# Patient Record
Sex: Female | Born: 1980 | Race: White | Hispanic: No | Marital: Single | Smoking: Former smoker
Health system: Southern US, Community
[De-identification: ages and names within clinical notes are randomized; demographics above are authoritative.]

## PROBLEM LIST (undated history)

## (undated) DIAGNOSIS — F419 Anxiety disorder, unspecified: Secondary | ICD-10-CM

## (undated) DIAGNOSIS — M5136 Other intervertebral disc degeneration, lumbar region: Secondary | ICD-10-CM

## (undated) DIAGNOSIS — D649 Anemia, unspecified: Secondary | ICD-10-CM

## (undated) DIAGNOSIS — G039 Meningitis, unspecified: Secondary | ICD-10-CM

## (undated) DIAGNOSIS — K589 Irritable bowel syndrome without diarrhea: Secondary | ICD-10-CM

## (undated) DIAGNOSIS — M51369 Other intervertebral disc degeneration, lumbar region without mention of lumbar back pain or lower extremity pain: Secondary | ICD-10-CM

## (undated) DIAGNOSIS — F329 Major depressive disorder, single episode, unspecified: Secondary | ICD-10-CM

## (undated) DIAGNOSIS — F32A Depression, unspecified: Secondary | ICD-10-CM

## (undated) DIAGNOSIS — R519 Headache, unspecified: Secondary | ICD-10-CM

## (undated) HISTORY — PX: TUBAL LIGATION: SHX77

---
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
## 2004-12-09 HISTORY — PX: ECTOPIC PREGNANCY SURGERY: SHX613

## 2011-12-10 HISTORY — PX: DILATION AND CURETTAGE OF UTERUS: SHX78

## 2015-02-27 ENCOUNTER — Encounter (HOSPITAL_COMMUNITY): Payer: Self-pay | Admitting: Obstetrics and Gynecology

## 2015-02-27 ENCOUNTER — Other Ambulatory Visit (HOSPITAL_COMMUNITY): Payer: Self-pay | Admitting: Obstetrics and Gynecology

## 2015-02-27 DIAGNOSIS — Z3689 Encounter for other specified antenatal screening: Secondary | ICD-10-CM

## 2015-02-27 DIAGNOSIS — O283 Abnormal ultrasonic finding on antenatal screening of mother: Secondary | ICD-10-CM

## 2015-02-27 DIAGNOSIS — O4103X1 Oligohydramnios, third trimester, fetus 1: Secondary | ICD-10-CM

## 2015-03-07 ENCOUNTER — Ambulatory Visit (HOSPITAL_COMMUNITY): Payer: Medicaid Other

## 2015-03-07 ENCOUNTER — Encounter (HOSPITAL_COMMUNITY): Payer: Self-pay

## 2017-12-17 ENCOUNTER — Other Ambulatory Visit (HOSPITAL_COMMUNITY): Payer: Self-pay | Admitting: Neurology

## 2017-12-17 DIAGNOSIS — G35 Multiple sclerosis: Secondary | ICD-10-CM

## 2017-12-25 ENCOUNTER — Ambulatory Visit (HOSPITAL_COMMUNITY)
Admission: RE | Admit: 2017-12-25 | Discharge: 2017-12-25 | Disposition: A | Discharge status: Home / Self Care | Payer: Medicaid Other | Source: Ambulatory Visit | Attending: Neurology | Admitting: Neurology

## 2017-12-25 DIAGNOSIS — M47892 Other spondylosis, cervical region: Secondary | ICD-10-CM | POA: Diagnosis not present

## 2017-12-25 DIAGNOSIS — G35 Multiple sclerosis: Secondary | ICD-10-CM | POA: Insufficient documentation

## 2018-01-01 ENCOUNTER — Other Ambulatory Visit (HOSPITAL_COMMUNITY): Payer: Self-pay | Admitting: Neurology

## 2018-01-01 DIAGNOSIS — M545 Low back pain: Secondary | ICD-10-CM

## 2018-01-12 ENCOUNTER — Ambulatory Visit (HOSPITAL_COMMUNITY)
Admission: RE | Admit: 2018-01-12 | Discharge: 2018-01-12 | Disposition: A | Discharge status: Home / Self Care | Payer: Medicaid Other | Source: Ambulatory Visit | Attending: Neurology | Admitting: Neurology

## 2018-01-12 ENCOUNTER — Ambulatory Visit (HOSPITAL_COMMUNITY): Payer: Medicaid Other

## 2018-01-12 ENCOUNTER — Other Ambulatory Visit (HOSPITAL_COMMUNITY): Payer: Self-pay | Admitting: Neurology

## 2018-01-12 DIAGNOSIS — M4186 Other forms of scoliosis, lumbar region: Secondary | ICD-10-CM | POA: Insufficient documentation

## 2018-01-12 DIAGNOSIS — M545 Low back pain: Secondary | ICD-10-CM | POA: Diagnosis not present

## 2018-01-12 DIAGNOSIS — M47896 Other spondylosis, lumbar region: Secondary | ICD-10-CM | POA: Diagnosis not present

## 2018-06-16 ENCOUNTER — Other Ambulatory Visit (HOSPITAL_COMMUNITY): Payer: Self-pay | Admitting: Neurology

## 2018-06-16 DIAGNOSIS — M5416 Radiculopathy, lumbar region: Secondary | ICD-10-CM

## 2018-06-25 ENCOUNTER — Ambulatory Visit (HOSPITAL_COMMUNITY): Payer: Medicaid Other

## 2018-07-03 ENCOUNTER — Ambulatory Visit (HOSPITAL_COMMUNITY)
Admission: RE | Admit: 2018-07-03 | Discharge: 2018-07-03 | Disposition: A | Discharge status: Home / Self Care | Payer: Medicaid Other | Source: Ambulatory Visit | Attending: Neurology | Admitting: Neurology

## 2018-07-03 DIAGNOSIS — M48061 Spinal stenosis, lumbar region without neurogenic claudication: Secondary | ICD-10-CM | POA: Diagnosis not present

## 2018-07-03 DIAGNOSIS — M5416 Radiculopathy, lumbar region: Secondary | ICD-10-CM | POA: Diagnosis present

## 2018-07-03 DIAGNOSIS — M5126 Other intervertebral disc displacement, lumbar region: Secondary | ICD-10-CM | POA: Diagnosis not present

## 2019-10-14 ENCOUNTER — Encounter (HOSPITAL_COMMUNITY): Payer: Self-pay | Admitting: Emergency Medicine

## 2019-10-14 ENCOUNTER — Emergency Department (HOSPITAL_COMMUNITY)
Admission: EM | Admit: 2019-10-14 | Discharge: 2019-10-14 | Disposition: A | Discharge status: Home / Self Care | Payer: Medicaid Other | Attending: Emergency Medicine | Admitting: Emergency Medicine

## 2019-10-14 ENCOUNTER — Other Ambulatory Visit: Payer: Self-pay

## 2019-10-14 DIAGNOSIS — M5410 Radiculopathy, site unspecified: Secondary | ICD-10-CM | POA: Insufficient documentation

## 2019-10-14 DIAGNOSIS — M25552 Pain in left hip: Secondary | ICD-10-CM | POA: Diagnosis present

## 2019-10-14 DIAGNOSIS — Z87891 Personal history of nicotine dependence: Secondary | ICD-10-CM | POA: Diagnosis not present

## 2019-10-14 DIAGNOSIS — M25572 Pain in left ankle and joints of left foot: Secondary | ICD-10-CM | POA: Insufficient documentation

## 2019-10-14 HISTORY — DX: Meningitis, unspecified: G03.9

## 2019-10-14 HISTORY — DX: Other intervertebral disc degeneration, lumbar region: M51.36

## 2019-10-14 HISTORY — DX: Other intervertebral disc degeneration, lumbar region without mention of lumbar back pain or lower extremity pain: M51.369

## 2019-10-14 LAB — URINALYSIS, ROUTINE W REFLEX MICROSCOPIC
Bilirubin Urine: NEGATIVE
Glucose, UA: NEGATIVE mg/dL
Ketones, ur: NEGATIVE mg/dL
Leukocytes,Ua: NEGATIVE
Nitrite: NEGATIVE
Protein, ur: NEGATIVE mg/dL
Specific Gravity, Urine: 1.014 (ref 1.005–1.030)
pH: 5 (ref 5.0–8.0)

## 2019-10-14 MED ORDER — HYDROMORPHONE HCL 1 MG/ML IJ SOLN
1.0000 mg | Freq: Once | INTRAMUSCULAR | Status: AC
  Administered 2019-10-14: 20:00:00 1 mg via INTRAMUSCULAR
  Filled 2019-10-14: qty 1

## 2019-10-14 NOTE — ED Notes (Signed)
Pt ambulated to rest room with no assist and without distress.

## 2019-10-14 NOTE — Discharge Instructions (Signed)
As discussed, call the radiology scheduling department at [phone] tomorrow to arrange an appointment time to have an MRI of your lower spine.  Be sure to keep your appointment with your pain management provider tomorrow.  Return to the ER for any worsening symptoms.

## 2019-10-14 NOTE — ED Provider Notes (Signed)
[facility] EMERGENCY DEPARTMENT Provider Note   CSN: [id number] Arrival date & time: 10/14/19  1630     History   Chief Complaint Chief Complaint  Patient presents with  . Hip Pain  . Ankle Pain    HPI RIVERLYN COYKENDALL is a 38 y.o. female.     HPI   ANVITHA LYBROOK is a 38 y.o. female with past medical history of lumbar degenerative disc disease and chronic back pain.  She presents to the Emergency Department complaining of worsening of her chronic pain for 3 weeks.  She describes a sharp burning pain in her left hip and left lateral ankle.  She states the pain is constant but worse with weightbearing.  At times, she states her leg feels as though it is going to "give away."  She also states that she has been having intermittent difficulties with urination and defecation.  She has been incontinent of stool and urine, but states this waxes and wanes and has been occurring for 1 week.   She states she was seen at another emergency department last week for her symptoms and received a injection for pain that provided temporary relief. She is followed by pain management and has been receiving steroid injections in her spine.  She is scheduled to see her pain management provider tomorrow.  She denies fever, chills, abdominal pain, pain with urination or defecation and numbness of the extremity.  She has been taking hydrocodone for her pain which provides minimal relief.        Past Medical History:  Diagnosis Date  . DDD (degenerative disc disease), lumbar   . DDD (degenerative disc disease), lumbar   . Meningitis     There are no active problems to display for this patient.   Past Surgical History:  Procedure Laterality Date  . CESAREAN SECTION    . DILATION AND CURETTAGE OF UTERUS    . TUBAL LIGATION       OB History   No obstetric history on file.      Home Medications    Prior to Admission medications   Not on File    Family History No family history on file.   Social History Social History   Tobacco Use  . Smoking status: Former Games developer  . Smokeless tobacco: Never Used  Substance Use Topics  . Alcohol use: Never    Frequency: Never  . Drug use: Never     Allergies   Patient has no known allergies.   Review of Systems Review of Systems  Constitutional: Negative for fever.  Respiratory: Negative for shortness of breath.   Gastrointestinal: Negative for abdominal pain, constipation and vomiting.  Genitourinary: Negative for decreased urine volume, difficulty urinating, dysuria, flank pain and hematuria.  Musculoskeletal: Positive for back pain. Negative for joint swelling.  Skin: Negative for rash.  Neurological: Negative for weakness and numbness.  All other systems reviewed and are negative.    Physical Exam Updated Vital Signs BP 139/67 (BP Location: Left Arm)   Pulse 99   Temp 98.2 F (36.8 C) (Oral)   Resp 16   Ht 5\' 8"  (1.727 m)   Wt 75.3 kg   LMP 10/11/2019   SpO2 99%   BMI 25.24 kg/m   Physical Exam Vitals signs and nursing note reviewed.  Constitutional:      General: She is not in acute distress.    Appearance: Normal appearance. She is well-developed.  HENT:     Head: Atraumatic.  Neck:     Musculoskeletal: Normal range of motion and neck supple.  Cardiovascular:     Rate and Rhythm: Normal rate and regular rhythm.     Pulses: Normal pulses.     Comments: DP pulses are strong and palpable bilaterally Pulmonary:     Effort: Pulmonary effort is normal. No respiratory distress.     Breath sounds: Normal breath sounds.  Abdominal:     General: There is no distension.     Palpations: Abdomen is soft.     Tenderness: There is no abdominal tenderness.  Genitourinary:    Rectum: No mass, tenderness or anal fissure. Normal anal tone.     Comments: nml rectal tone, no tenderness or palpable rectal masses.   Musculoskeletal:        General: Tenderness present.     Lumbar back: She exhibits tenderness and  pain. She exhibits normal range of motion, no swelling, no deformity, no laceration and normal pulse.     Right lower leg: No edema.     Left lower leg: No edema.     Comments: ttp of the lower lumbar spine and left SI joint.  No bony step offs or edema.  ttp of the lateral left hip with pain on flexion of the hip.  Pt unable to perform SLR on left due to level of pain.    Skin:    General: Skin is warm.     Capillary Refill: Capillary refill takes less than 2 seconds.     Findings: No rash.  Neurological:     Mental Status: She is alert and oriented to person, place, and time.     Sensory: No sensory deficit.     Motor: No abnormal muscle tone.     Coordination: Coordination normal.     Gait: Gait normal.     Deep Tendon Reflexes:     Reflex Scores:      Patellar reflexes are 2+ on the right side and 2+ on the left side.      Achilles reflexes are 2+ on the right side and 2+ on the left side.     ED Treatments / Results  Labs (all labs ordered are listed, but only abnormal results are displayed) Labs Reviewed  URINALYSIS, ROUTINE W REFLEX MICROSCOPIC - Abnormal; Notable for the following components:      Result Value   Hgb urine dipstick SMALL (*)    Bacteria, UA RARE (*)    All other components within normal limits    EKG None  Radiology No results found.  Procedures Procedures (including critical care time)  Medications Ordered in ED Medications - No data to display   Initial Impression / Assessment and Plan / ED Course  I have reviewed the triage vital signs and the nursing notes.  Pertinent labs & imaging results that were available during my care of the patient were reviewed by me and considered in my medical decision making (see chart for details).        Pt with likely acute on chronic pain radicular back pain.  No focal neuro deficits on my exam.  She is ambulatory with a slow but steady gait.  No foot drop.  She describes intermittent episodes of  incontinence of stool and urine for 1 week, but but the episodes are sporadic.  She has good rectal tone on digital exam.  Clinically, my suspicion for cauda equina is low.  She is ambulated to the restroom and voided for urinalysis  without difficulty or incontinence.  She has received spinal epidurals in the past, but none recently.  She is nontoxic-appearing, afebrile and pain is not out of proportion to exam findings. I do not feel that back pain is related to a spinal abscess.  On review of medical records, she had MRI of her L-spine in July 2019 that showed chronic disc and endplate degeneration at L4-L5 and 5 S1 with a broad-based disc protrusion at the L4-5 level with mild spinal stenosis with right greater than left.  Symptoms this evening are left-sided, I feel that repeat MRI is appropriate but not necessarily needed on an emergent basis.  I have discussed findings with Dr. Stark Jock and I will order an MRI of her L-spine on out patient basis.   She has an appointment with her pain management provider tomorrow.  I feel that she is appropriate for d/c home, pain improved after IM medication.    Final Clinical Impressions(s) / ED Diagnoses   Final diagnoses:  Radicular low back pain    ED Discharge Orders    None       Kem Parkinson, PA-C 10/14/19 2334    Veryl Speak, MD 10/20/19 0730

## 2019-10-14 NOTE — ED Triage Notes (Signed)
Pt states that her left hip down her leg to her ankle is hurting her she denies injury. She was seen at [facility] for this and given a steroid shot and told to see her pain doctor.

## 2019-10-15 ENCOUNTER — Ambulatory Visit (HOSPITAL_COMMUNITY)
Admission: RE | Admit: 2019-10-15 | Discharge: 2019-10-15 | Disposition: A | Discharge status: Home / Self Care | Payer: Medicaid Other | Source: Ambulatory Visit | Attending: Neurology | Admitting: Neurology

## 2019-10-15 ENCOUNTER — Other Ambulatory Visit (HOSPITAL_COMMUNITY): Payer: Self-pay | Admitting: Neurology

## 2019-10-15 ENCOUNTER — Other Ambulatory Visit: Payer: Self-pay | Admitting: Neurology

## 2019-10-15 ENCOUNTER — Ambulatory Visit (HOSPITAL_COMMUNITY): Payer: Medicaid Other

## 2019-10-15 DIAGNOSIS — R29898 Other symptoms and signs involving the musculoskeletal system: Secondary | ICD-10-CM

## 2019-10-15 DIAGNOSIS — N39498 Other specified urinary incontinence: Secondary | ICD-10-CM

## 2019-10-15 DIAGNOSIS — M545 Low back pain, unspecified: Secondary | ICD-10-CM

## 2019-10-15 DIAGNOSIS — M25552 Pain in left hip: Secondary | ICD-10-CM

## 2019-10-22 ENCOUNTER — Other Ambulatory Visit: Payer: Self-pay

## 2019-10-22 ENCOUNTER — Ambulatory Visit (HOSPITAL_COMMUNITY)
Admission: RE | Admit: 2019-10-22 | Discharge: 2019-10-22 | Disposition: A | Discharge status: Home / Self Care | Payer: Medicaid Other | Source: Ambulatory Visit | Attending: Neurology | Admitting: Neurology

## 2019-10-22 DIAGNOSIS — M545 Low back pain, unspecified: Secondary | ICD-10-CM

## 2019-10-22 DIAGNOSIS — N39498 Other specified urinary incontinence: Secondary | ICD-10-CM | POA: Insufficient documentation

## 2019-10-22 DIAGNOSIS — R29898 Other symptoms and signs involving the musculoskeletal system: Secondary | ICD-10-CM

## 2019-10-22 DIAGNOSIS — N401 Enlarged prostate with lower urinary tract symptoms: Secondary | ICD-10-CM

## 2019-10-28 ENCOUNTER — Other Ambulatory Visit: Payer: Self-pay | Admitting: Neurological Surgery

## 2019-10-29 ENCOUNTER — Other Ambulatory Visit (HOSPITAL_COMMUNITY)
Admission: RE | Admit: 2019-10-29 | Discharge: 2019-10-29 | Disposition: A | Discharge status: Home / Self Care | Payer: Medicaid Other | Source: Ambulatory Visit | Attending: Neurological Surgery | Admitting: Neurological Surgery

## 2019-10-29 ENCOUNTER — Other Ambulatory Visit: Payer: Self-pay

## 2019-10-29 ENCOUNTER — Encounter (HOSPITAL_COMMUNITY): Payer: Self-pay | Admitting: *Deleted

## 2019-10-29 DIAGNOSIS — Z20828 Contact with and (suspected) exposure to other viral communicable diseases: Secondary | ICD-10-CM | POA: Insufficient documentation

## 2019-10-29 DIAGNOSIS — Z01812 Encounter for preprocedural laboratory examination: Secondary | ICD-10-CM | POA: Diagnosis not present

## 2019-10-29 LAB — SARS CORONAVIRUS 2 (TAT 6-24 HRS): SARS Coronavirus 2: NEGATIVE

## 2019-10-29 NOTE — Progress Notes (Signed)
Spoke with pt for pre-op call. Pt denies cardiac history, HTN or Diabetes.   Pt had Covid test done today and she states she is in quarantine and understands that she needs to stay in quarantine until day of surgery.  Visitation policy reviewed with pt and she voiced understanding.

## 2019-10-30 ENCOUNTER — Telehealth: Payer: Self-pay

## 2019-10-30 NOTE — Telephone Encounter (Signed)
Pt called for PAT covid test results. Pt advised results are negative. Pt verbalized understanding.

## 2019-11-01 ENCOUNTER — Ambulatory Visit (HOSPITAL_COMMUNITY): Payer: Medicaid Other

## 2019-11-01 ENCOUNTER — Other Ambulatory Visit: Payer: Self-pay

## 2019-11-01 ENCOUNTER — Ambulatory Visit (HOSPITAL_COMMUNITY): Payer: Medicaid Other | Admitting: Anesthesiology

## 2019-11-01 ENCOUNTER — Ambulatory Visit (HOSPITAL_COMMUNITY)
Admission: RE | Admit: 2019-11-01 | Discharge: 2019-11-01 | Disposition: A | Discharge status: Home / Self Care | Payer: Medicaid Other | Attending: Neurological Surgery | Admitting: Neurological Surgery

## 2019-11-01 ENCOUNTER — Encounter (HOSPITAL_COMMUNITY)
Admission: RE | Disposition: A | Discharge status: Home / Self Care | Payer: Self-pay | Source: Home / Self Care | Attending: Neurological Surgery

## 2019-11-01 ENCOUNTER — Encounter (HOSPITAL_COMMUNITY): Payer: Self-pay

## 2019-11-01 DIAGNOSIS — M5126 Other intervertebral disc displacement, lumbar region: Secondary | ICD-10-CM | POA: Diagnosis present

## 2019-11-01 DIAGNOSIS — Z791 Long term (current) use of non-steroidal anti-inflammatories (NSAID): Secondary | ICD-10-CM | POA: Diagnosis not present

## 2019-11-01 DIAGNOSIS — G950 Syringomyelia and syringobulbia: Secondary | ICD-10-CM | POA: Diagnosis not present

## 2019-11-01 DIAGNOSIS — Z79899 Other long term (current) drug therapy: Secondary | ICD-10-CM | POA: Diagnosis not present

## 2019-11-01 DIAGNOSIS — K589 Irritable bowel syndrome without diarrhea: Secondary | ICD-10-CM | POA: Insufficient documentation

## 2019-11-01 DIAGNOSIS — R519 Headache, unspecified: Secondary | ICD-10-CM | POA: Diagnosis not present

## 2019-11-01 DIAGNOSIS — F329 Major depressive disorder, single episode, unspecified: Secondary | ICD-10-CM | POA: Insufficient documentation

## 2019-11-01 DIAGNOSIS — D649 Anemia, unspecified: Secondary | ICD-10-CM | POA: Diagnosis not present

## 2019-11-01 DIAGNOSIS — Z8661 Personal history of infections of the central nervous system: Secondary | ICD-10-CM | POA: Insufficient documentation

## 2019-11-01 DIAGNOSIS — M1990 Unspecified osteoarthritis, unspecified site: Secondary | ICD-10-CM | POA: Diagnosis not present

## 2019-11-01 DIAGNOSIS — Z87891 Personal history of nicotine dependence: Secondary | ICD-10-CM | POA: Insufficient documentation

## 2019-11-01 DIAGNOSIS — F419 Anxiety disorder, unspecified: Secondary | ICD-10-CM | POA: Insufficient documentation

## 2019-11-01 DIAGNOSIS — M48061 Spinal stenosis, lumbar region without neurogenic claudication: Secondary | ICD-10-CM | POA: Diagnosis not present

## 2019-11-01 DIAGNOSIS — Z419 Encounter for procedure for purposes other than remedying health state, unspecified: Secondary | ICD-10-CM

## 2019-11-01 DIAGNOSIS — Z9889 Other specified postprocedural states: Secondary | ICD-10-CM

## 2019-11-01 DIAGNOSIS — M5116 Intervertebral disc disorders with radiculopathy, lumbar region: Secondary | ICD-10-CM | POA: Diagnosis not present

## 2019-11-01 HISTORY — DX: Irritable bowel syndrome, unspecified: K58.9

## 2019-11-01 HISTORY — DX: Headache, unspecified: R51.9

## 2019-11-01 HISTORY — PX: LUMBAR LAMINECTOMY/DECOMPRESSION MICRODISCECTOMY: SHX5026

## 2019-11-01 HISTORY — DX: Anxiety disorder, unspecified: F41.9

## 2019-11-01 HISTORY — DX: Anemia, unspecified: D64.9

## 2019-11-01 HISTORY — DX: Depression, unspecified: F32.A

## 2019-11-01 LAB — CBC WITH DIFFERENTIAL/PLATELET
Abs Immature Granulocytes: 0.04 10*3/uL (ref 0.00–0.07)
Basophils Absolute: 0 10*3/uL (ref 0.0–0.1)
Basophils Relative: 0 %
Eosinophils Absolute: 0.1 10*3/uL (ref 0.0–0.5)
Eosinophils Relative: 1 %
HCT: 35.1 % — ABNORMAL LOW (ref 36.0–46.0)
Hemoglobin: 11.1 g/dL — ABNORMAL LOW (ref 12.0–15.0)
Immature Granulocytes: 0 %
Lymphocytes Relative: 16 %
Lymphs Abs: 1.6 10*3/uL (ref 0.7–4.0)
MCH: 26.8 pg (ref 26.0–34.0)
MCHC: 31.6 g/dL (ref 30.0–36.0)
MCV: 84.8 fL (ref 80.0–100.0)
Monocytes Absolute: 1.1 10*3/uL — ABNORMAL HIGH (ref 0.1–1.0)
Monocytes Relative: 11 %
Neutro Abs: 7.4 10*3/uL (ref 1.7–7.7)
Neutrophils Relative %: 72 %
Platelets: 392 10*3/uL (ref 150–400)
RBC: 4.14 MIL/uL (ref 3.87–5.11)
RDW: 14.7 % (ref 11.5–15.5)
WBC: 10.3 10*3/uL (ref 4.0–10.5)
nRBC: 0 % (ref 0.0–0.2)

## 2019-11-01 LAB — BASIC METABOLIC PANEL
Anion gap: 7 (ref 5–15)
BUN: 13 mg/dL (ref 6–20)
CO2: 26 mmol/L (ref 22–32)
Calcium: 8.7 mg/dL — ABNORMAL LOW (ref 8.9–10.3)
Chloride: 102 mmol/L (ref 98–111)
Creatinine, Ser: 0.91 mg/dL (ref 0.44–1.00)
GFR calc Af Amer: >60 mL/min (ref >60)
GFR calc non Af Amer: >60 mL/min (ref >60)
Glucose, Bld: 90 mg/dL (ref 70–99)
Potassium: 3.7 mmol/L (ref 3.5–5.1)
Sodium: 135 mmol/L (ref 135–145)

## 2019-11-01 LAB — POCT PREGNANCY, URINE: Preg Test, Ur: NEGATIVE

## 2019-11-01 LAB — PROTIME-INR
INR: 0.9 (ref 0.8–1.2)
Prothrombin Time: 12 seconds (ref 11.4–15.2)

## 2019-11-01 SURGERY — LUMBAR LAMINECTOMY/DECOMPRESSION MICRODISCECTOMY 1 LEVEL
Anesthesia: General | Site: Back | Laterality: Left

## 2019-11-01 MED ORDER — CEFAZOLIN SODIUM-DEXTROSE 2-4 GM/100ML-% IV SOLN
2.0000 g | INTRAVENOUS | Status: AC
  Administered 2019-11-01: 2 g via INTRAVENOUS

## 2019-11-01 MED ORDER — MIDAZOLAM HCL 2 MG/2ML IJ SOLN
INTRAMUSCULAR | Status: AC
  Filled 2019-11-01: qty 2

## 2019-11-01 MED ORDER — GABAPENTIN 600 MG PO TABS
600.0000 mg | ORAL_TABLET | Freq: Four times a day (QID) | ORAL | Status: DC
  Administered 2019-11-01: 15:00:00 600 mg via ORAL
  Filled 2019-11-01: qty 1

## 2019-11-01 MED ORDER — OXYCODONE HCL 5 MG PO TABS: 5.0000 mg | ORAL_TABLET | ORAL | Status: DC | PRN

## 2019-11-01 MED ORDER — DEXAMETHASONE SODIUM PHOSPHATE 10 MG/ML IJ SOLN
10.0000 mg | Freq: Once | INTRAMUSCULAR | Status: AC
  Administered 2019-11-01: 10 mg via INTRAVENOUS

## 2019-11-01 MED ORDER — MORPHINE SULFATE (PF) 2 MG/ML IV SOLN: 2.0000 mg | INTRAVENOUS | Status: DC | PRN

## 2019-11-01 MED ORDER — DEXAMETHASONE 4 MG PO TABS: 4.0000 mg | ORAL_TABLET | Freq: Four times a day (QID) | ORAL | Status: DC

## 2019-11-01 MED ORDER — ACETAMINOPHEN 10 MG/ML IV SOLN
INTRAVENOUS | Status: DC | PRN
  Administered 2019-11-01: 1000 mg via INTRAVENOUS

## 2019-11-01 MED ORDER — ACETAMINOPHEN 325 MG PO TABS: 650.0000 mg | ORAL_TABLET | ORAL | Status: DC | PRN

## 2019-11-01 MED ORDER — THROMBIN 5000 UNITS EX SOLR
CUTANEOUS | Status: AC
  Filled 2019-11-01: qty 15000

## 2019-11-01 MED ORDER — PROPOFOL 10 MG/ML IV BOLUS
INTRAVENOUS | Status: DC | PRN
  Administered 2019-11-01: 150 mg via INTRAVENOUS

## 2019-11-01 MED ORDER — OXYCODONE HCL 5 MG PO TABS
10.0000 mg | ORAL_TABLET | Freq: Once | ORAL | Status: AC
  Administered 2019-11-01: 10 mg via ORAL

## 2019-11-01 MED ORDER — SCOPOLAMINE 1 MG/3DAYS TD PT72
MEDICATED_PATCH | TRANSDERMAL | Status: AC
  Filled 2019-11-01: qty 1

## 2019-11-01 MED ORDER — ONDANSETRON HCL 4 MG/2ML IJ SOLN: 4.0000 mg | Freq: Four times a day (QID) | INTRAMUSCULAR | Status: DC | PRN

## 2019-11-01 MED ORDER — THROMBIN 5000 UNITS EX SOLR
OROMUCOSAL | Status: DC | PRN
  Administered 2019-11-01: 5 mL via TOPICAL

## 2019-11-01 MED ORDER — SODIUM CHLORIDE 0.9% FLUSH: 3.0000 mL | Freq: Two times a day (BID) | INTRAVENOUS | Status: DC

## 2019-11-01 MED ORDER — HEMOSTATIC AGENTS (NO CHARGE) OPTIME
TOPICAL | Status: DC | PRN
  Administered 2019-11-01: 1 via TOPICAL

## 2019-11-01 MED ORDER — LIDOCAINE 2% (20 MG/ML) 5 ML SYRINGE
INTRAMUSCULAR | Status: DC | PRN
  Administered 2019-11-01: 60 mg via INTRAVENOUS

## 2019-11-01 MED ORDER — PROMETHAZINE HCL 25 MG/ML IJ SOLN: 6.2500 mg | INTRAMUSCULAR | Status: DC | PRN

## 2019-11-01 MED ORDER — SCOPOLAMINE 1 MG/3DAYS TD PT72
1.0000 | MEDICATED_PATCH | TRANSDERMAL | Status: DC
  Administered 2019-11-01: 1.5 mg via TRANSDERMAL
  Filled 2019-11-01: qty 1

## 2019-11-01 MED ORDER — OXYCODONE HCL 5 MG PO TABS
ORAL_TABLET | ORAL | Status: AC
  Filled 2019-11-01: qty 2

## 2019-11-01 MED ORDER — SODIUM CHLORIDE 0.9 % IV SOLN: 250.0000 mL | INTRAVENOUS | Status: DC

## 2019-11-01 MED ORDER — ACETAMINOPHEN 650 MG RE SUPP: 650.0000 mg | RECTAL | Status: DC | PRN

## 2019-11-01 MED ORDER — CHLORHEXIDINE GLUCONATE CLOTH 2 % EX PADS: 6.0000 | MEDICATED_PAD | Freq: Once | CUTANEOUS | Status: DC

## 2019-11-01 MED ORDER — LACTATED RINGERS IV SOLN
INTRAVENOUS | Status: DC
  Administered 2019-11-01: 11:00:00 via INTRAVENOUS

## 2019-11-01 MED ORDER — FENTANYL CITRATE (PF) 100 MCG/2ML IJ SOLN
INTRAMUSCULAR | Status: DC | PRN
  Administered 2019-11-01: 100 ug via INTRAVENOUS

## 2019-11-01 MED ORDER — KETOROLAC TROMETHAMINE 30 MG/ML IJ SOLN
INTRAMUSCULAR | Status: DC | PRN
  Administered 2019-11-01: 30 mg via INTRAVENOUS

## 2019-11-01 MED ORDER — POTASSIUM CHLORIDE IN NACL 20-0.9 MEQ/L-% IV SOLN: INTRAVENOUS | Status: DC

## 2019-11-01 MED ORDER — SUGAMMADEX SODIUM 200 MG/2ML IV SOLN
INTRAVENOUS | Status: DC | PRN
  Administered 2019-11-01 (×2): 100 mg via INTRAVENOUS

## 2019-11-01 MED ORDER — ACETAMINOPHEN 10 MG/ML IV SOLN
INTRAVENOUS | Status: AC
  Filled 2019-11-01: qty 100

## 2019-11-01 MED ORDER — SODIUM CHLORIDE 0.9 % IV SOLN
INTRAVENOUS | Status: DC | PRN
  Administered 2019-11-01: 500 mL

## 2019-11-01 MED ORDER — ONDANSETRON HCL 4 MG PO TABS: 4.0000 mg | ORAL_TABLET | Freq: Four times a day (QID) | ORAL | Status: DC | PRN

## 2019-11-01 MED ORDER — SUCCINYLCHOLINE CHLORIDE 20 MG/ML IJ SOLN
INTRAMUSCULAR | Status: DC | PRN
  Administered 2019-11-01: 100 mg via INTRAVENOUS

## 2019-11-01 MED ORDER — SODIUM CHLORIDE 0.9% FLUSH: 3.0000 mL | INTRAVENOUS | Status: DC | PRN

## 2019-11-01 MED ORDER — MEPERIDINE HCL 25 MG/ML IJ SOLN: 6.2500 mg | INTRAMUSCULAR | Status: DC | PRN

## 2019-11-01 MED ORDER — PAROXETINE HCL 10 MG PO TABS
10.0000 mg | ORAL_TABLET | Freq: Every day | ORAL | Status: DC
  Filled 2019-11-01: qty 1

## 2019-11-01 MED ORDER — FENTANYL CITRATE (PF) 250 MCG/5ML IJ SOLN
INTRAMUSCULAR | Status: AC
  Filled 2019-11-01: qty 5

## 2019-11-01 MED ORDER — DEXAMETHASONE SODIUM PHOSPHATE 10 MG/ML IJ SOLN
INTRAMUSCULAR | Status: AC
  Filled 2019-11-01: qty 1

## 2019-11-01 MED ORDER — CEFAZOLIN SODIUM-DEXTROSE 2-4 GM/100ML-% IV SOLN
INTRAVENOUS | Status: AC
  Filled 2019-11-01: qty 100

## 2019-11-01 MED ORDER — ONDANSETRON HCL 4 MG/2ML IJ SOLN
INTRAMUSCULAR | Status: DC | PRN
  Administered 2019-11-01: 4 mg via INTRAVENOUS

## 2019-11-01 MED ORDER — CEFAZOLIN SODIUM-DEXTROSE 2-4 GM/100ML-% IV SOLN: 2.0000 g | Freq: Three times a day (TID) | INTRAVENOUS | Status: DC

## 2019-11-01 MED ORDER — HYDROMORPHONE HCL 1 MG/ML IJ SOLN: 0.2500 mg | INTRAMUSCULAR | Status: DC | PRN

## 2019-11-01 MED ORDER — ROCURONIUM BROMIDE 50 MG/5ML IV SOSY
PREFILLED_SYRINGE | INTRAVENOUS | Status: DC | PRN
  Administered 2019-11-01: 40 mg via INTRAVENOUS

## 2019-11-01 MED ORDER — MIDAZOLAM HCL 5 MG/5ML IJ SOLN
INTRAMUSCULAR | Status: DC | PRN
  Administered 2019-11-01: 2 mg via INTRAVENOUS

## 2019-11-01 MED ORDER — DEXAMETHASONE SODIUM PHOSPHATE 4 MG/ML IJ SOLN
4.0000 mg | Freq: Four times a day (QID) | INTRAMUSCULAR | Status: DC
  Administered 2019-11-01: 16:00:00 4 mg via INTRAVENOUS
  Filled 2019-11-01: qty 1

## 2019-11-01 MED ORDER — DIPHENHYDRAMINE HCL 50 MG/ML IJ SOLN
INTRAMUSCULAR | Status: DC | PRN
  Administered 2019-11-01: 12.5 mg via INTRAVENOUS

## 2019-11-01 MED ORDER — BUPIVACAINE HCL (PF) 0.25 % IJ SOLN
INTRAMUSCULAR | Status: AC
  Filled 2019-11-01: qty 30

## 2019-11-01 MED ORDER — LACTATED RINGERS IV SOLN: INTRAVENOUS | Status: DC

## 2019-11-01 MED ORDER — SENNA 8.6 MG PO TABS
1.0000 | ORAL_TABLET | Freq: Two times a day (BID) | ORAL | Status: DC
  Administered 2019-11-01: 15:00:00 8.6 mg via ORAL
  Filled 2019-11-01: qty 1

## 2019-11-01 MED ORDER — PROPOFOL 10 MG/ML IV BOLUS
INTRAVENOUS | Status: AC
  Filled 2019-11-01: qty 20

## 2019-11-01 MED ORDER — TIZANIDINE HCL 4 MG PO TABS: 2.0000 mg | ORAL_TABLET | Freq: Every day | ORAL | Status: DC

## 2019-11-01 MED ORDER — PHENOL 1.4 % MT LIQD: 1.0000 | OROMUCOSAL | Status: DC | PRN

## 2019-11-01 MED ORDER — OXYCODONE-ACETAMINOPHEN 5-325 MG PO TABS: 1.0000 | ORAL_TABLET | ORAL | Status: DC | PRN

## 2019-11-01 MED ORDER — BUPIVACAINE HCL (PF) 0.25 % IJ SOLN
INTRAMUSCULAR | Status: DC | PRN
  Administered 2019-11-01: 8 mL

## 2019-11-01 MED ORDER — OXYCODONE-ACETAMINOPHEN 10-325 MG PO TABS: 1.0000 | ORAL_TABLET | ORAL | Status: DC

## 2019-11-01 MED ORDER — THROMBIN 5000 UNITS EX SOLR
CUTANEOUS | Status: DC | PRN
  Administered 2019-11-01: 10000 [IU] via TOPICAL

## 2019-11-01 MED ORDER — MENTHOL 3 MG MT LOZG: 1.0000 | LOZENGE | OROMUCOSAL | Status: DC | PRN

## 2019-11-01 MED ORDER — 0.9 % SODIUM CHLORIDE (POUR BTL) OPTIME
TOPICAL | Status: DC | PRN
  Administered 2019-11-01: 1000 mL

## 2019-11-01 MED ORDER — OXYCODONE-ACETAMINOPHEN 5-325 MG PO TABS
2.0000 | ORAL_TABLET | ORAL | Status: DC | PRN
  Administered 2019-11-01: 2 via ORAL
  Filled 2019-11-01: qty 2

## 2019-11-01 SURGICAL SUPPLY — 46 items
BAG DECANTER FOR FLEXI CONT (MISCELLANEOUS) ×3 IMPLANT
BAND RUBBER #18 3X1/16 STRL (MISCELLANEOUS) ×6 IMPLANT
BENZOIN TINCTURE PRP APPL 2/3 (GAUZE/BANDAGES/DRESSINGS) ×3 IMPLANT
BUR MATCHSTICK NEURO 3.0 LAGG (BURR) ×3 IMPLANT
CANISTER SUCT 3000ML PPV (MISCELLANEOUS) ×3 IMPLANT
CARTRIDGE OIL MAESTRO DRILL (MISCELLANEOUS) ×1 IMPLANT
CLOSURE WOUND 1/2 X4 (GAUZE/BANDAGES/DRESSINGS) ×1
COVER WAND RF STERILE (DRAPES) ×1 IMPLANT
DIFFUSER DRILL AIR PNEUMATIC (MISCELLANEOUS) ×3 IMPLANT
DRAPE LAPAROTOMY 100X72X124 (DRAPES) ×3 IMPLANT
DRAPE MICROSCOPE LEICA (MISCELLANEOUS) ×3 IMPLANT
DRAPE SURG 17X23 STRL (DRAPES) ×3 IMPLANT
DRSG OPSITE POSTOP 3X4 (GAUZE/BANDAGES/DRESSINGS) ×2 IMPLANT
DURAPREP 26ML APPLICATOR (WOUND CARE) ×3 IMPLANT
ELECT REM PT RETURN 9FT ADLT (ELECTROSURGICAL) ×3
ELECTRODE REM PT RTRN 9FT ADLT (ELECTROSURGICAL) ×1 IMPLANT
GAUZE 4X4 16PLY RFD (DISPOSABLE) IMPLANT
GLOVE BIO SURGEON STRL SZ7 (GLOVE) IMPLANT
GLOVE BIO SURGEON STRL SZ8 (GLOVE) ×3 IMPLANT
GLOVE BIOGEL PI IND STRL 7.0 (GLOVE) IMPLANT
GLOVE BIOGEL PI INDICATOR 7.0 (GLOVE)
GOWN STRL REUS W/ TWL LRG LVL3 (GOWN DISPOSABLE) IMPLANT
GOWN STRL REUS W/ TWL XL LVL3 (GOWN DISPOSABLE) ×1 IMPLANT
GOWN STRL REUS W/TWL 2XL LVL3 (GOWN DISPOSABLE) IMPLANT
GOWN STRL REUS W/TWL LRG LVL3 (GOWN DISPOSABLE)
GOWN STRL REUS W/TWL XL LVL3 (GOWN DISPOSABLE) ×2
HEMOSTAT POWDER KIT SURGIFOAM (HEMOSTASIS) ×2 IMPLANT
KIT BASIN OR (CUSTOM PROCEDURE TRAY) ×3 IMPLANT
KIT TURNOVER KIT B (KITS) ×3 IMPLANT
NDL HYPO 25X1 1.5 SAFETY (NEEDLE) ×1 IMPLANT
NDL SPNL 20GX3.5 QUINCKE YW (NEEDLE) IMPLANT
NEEDLE HYPO 25X1 1.5 SAFETY (NEEDLE) ×3 IMPLANT
NEEDLE SPNL 20GX3.5 QUINCKE YW (NEEDLE) IMPLANT
NS IRRIG 1000ML POUR BTL (IV SOLUTION) ×3 IMPLANT
OIL CARTRIDGE MAESTRO DRILL (MISCELLANEOUS) ×3
PACK LAMINECTOMY NEURO (CUSTOM PROCEDURE TRAY) ×3 IMPLANT
PAD ARMBOARD 7.5X6 YLW CONV (MISCELLANEOUS) ×9 IMPLANT
SPONGE SURGIFOAM ABS GEL SZ50 (HEMOSTASIS) ×2 IMPLANT
STRIP CLOSURE SKIN 1/2X4 (GAUZE/BANDAGES/DRESSINGS) ×2 IMPLANT
SUT VIC AB 0 CT1 18XCR BRD8 (SUTURE) ×1 IMPLANT
SUT VIC AB 0 CT1 8-18 (SUTURE) ×2
SUT VIC AB 2-0 CP2 18 (SUTURE) ×3 IMPLANT
SUT VIC AB 3-0 SH 8-18 (SUTURE) ×5 IMPLANT
TOWEL GREEN STERILE (TOWEL DISPOSABLE) ×3 IMPLANT
TOWEL GREEN STERILE FF (TOWEL DISPOSABLE) ×3 IMPLANT
WATER STERILE IRR 1000ML POUR (IV SOLUTION) ×3 IMPLANT

## 2019-11-01 NOTE — Progress Notes (Signed)
Patient is discharged from room 3C09 at this time. Alert and in stable condition. IV site d/c'd and instructions read to patient and spouse with understanding verbalized. Left unit via wheelchair with all belongings at side. 

## 2019-11-01 NOTE — Transfer of Care (Signed)
Immediate Anesthesia Transfer of Care Note  Patient: Aura Camps  Procedure(s) Performed: Microdiscectomy - Lumbar Four-Lumbar Five - left (Left Back)  Patient Location: PACU  Anesthesia Type:General  Level of Consciousness: awake, alert  and oriented  Airway & Oxygen Therapy: Patient Spontanous Breathing and Patient connected to nasal cannula oxygen  Post-op Assessment: Report given to RN and Post -op Vital signs reviewed and stable  Post vital signs: Reviewed and stable  Last Vitals:  Vitals Value Taken Time  BP 104/55 11/01/19 1224  Temp    Pulse 84 11/01/19 1228  Resp 17 11/01/19 1228  SpO2 100 % 11/01/19 1228  Vitals shown include unvalidated device data.  Last Pain:  Vitals:   11/01/19 1028  PainSc: 6       Patients Stated Pain Goal: 3 (09/81/19 1478)  Complications: No apparent anesthesia complications

## 2019-11-01 NOTE — H&P (Signed)
Subjective: Patient is a 38 y.o. female admitted for L L4-5 HNP. Onset of symptoms was several weeks ago, rapidly worsening since that time.  The pain is rated intense, unremitting, and is located at the across the lower back and radiates to left leg. The pain is described as aching and occurs all day. The symptoms have been progressive. Symptoms are exacerbated by exercise. MRI or CT showed HNP l4-5 with stenosis   Past Medical History:  Diagnosis Date  . Anemia   . Anxiety   . DDD (degenerative disc disease), lumbar    and neck  . DDD (degenerative disc disease), lumbar   . Depression   . Headache    migraines  . IBS (irritable bowel syndrome)   . Meningitis    2015    Past Surgical History:  Procedure Laterality Date  . CESAREAN SECTION     x 3  . DILATION AND CURETTAGE OF UTERUS  2013  . ECTOPIC PREGNANCY SURGERY  2006  . TUBAL LIGATION      Prior to Admission medications   Medication Sig Start Date End Date Taking? Authorizing Provider  gabapentin (NEURONTIN) 600 MG tablet Take 600 mg by mouth 4 (four) times daily. 10/04/19  Yes [provider]  ibuprofen (ADVIL) 800 MG tablet Take 800 mg by mouth 3 (three) times daily with meals.  10/11/19  Yes [provider]  PARoxetine (PAXIL) 10 MG tablet Take 10 mg by mouth at bedtime. 10/04/19  Yes [provider]  PERCOCET 10-325 MG tablet Take 1 tablet by mouth every 4 (four) hours. 10/25/19  Yes [provider]  tiZANidine (ZANAFLEX) 2 MG tablet Take 2 mg by mouth at bedtime. 08/20/19  Yes [provider]  traMADol (ULTRAM) 50 MG tablet Take 100 mg by mouth every 8 (eight) hours as needed for pain. 09/17/19  Yes [provider]  traZODone (DESYREL) 50 MG tablet Take 100 mg by mouth at bedtime.   Yes [provider]   No Known Allergies  Social History   Tobacco Use  . Smoking status: Former Smoker    Quit date: 10/28/2004    Years since quitting: 15.0  . Smokeless  tobacco: Never Used  Substance Use Topics  . Alcohol use: Yes    Frequency: Never    Comment: occasionally    History reviewed. No pertinent family history.   Review of Systems  Positive ROS: neg  All other systems have been reviewed and were otherwise negative with the exception of those mentioned in the HPI and as above.  Objective: Vital signs in last 24 hours:    General Appearance: Alert, cooperative, no distress, appears stated age Head: Normocephalic, without obvious abnormality, atraumatic Eyes: PERRL, conjunctiva/corneas clear, EOM's intact    Neck: Supple, symmetrical, trachea midline Back: Symmetric, no curvature, ROM normal, no CVA tenderness Lungs:  respirations unlabored Heart: Regular rate and rhythm Abdomen: Soft, non-tender Extremities: Extremities normal, atraumatic, no cyanosis or edema Pulses: 2+ and symmetric all extremities Skin: Skin color, texture, turgor normal, no rashes or lesions  NEUROLOGIC:   Mental status: Alert and oriented x4,  no aphasia, good attention span, fund of knowledge, and memory Motor Exam - grossly normal Sensory Exam - grossly normal Reflexes: 1+ Coordination - grossly normal Gait - grossly normal Balance - grossly normal Cranial Nerves: I: smell Not tested  II: visual acuity  OS: nl    OD: nl  II: visual fields Full to confrontation  II: pupils Equal, round, reactive  to light  III,VII: ptosis None  III,IV,VI: extraocular muscles  Full ROM  V: mastication Normal  V: facial light touch sensation  Normal  V,VII: corneal reflex  Present  VII: facial muscle function - upper  Normal  VII: facial muscle function - lower Normal  VIII: hearing Not tested  IX: soft palate elevation  Normal  IX,X: gag reflex Present  XI: trapezius strength  5/5  XI: sternocleidomastoid strength 5/5  XI: neck flexion strength  5/5  XII: tongue strength  Normal    Data Review Lab Results  Component Value Date   WBC 10.3 11/01/2019   HGB  11.1 (L) 11/01/2019   HCT 35.1 (L) 11/01/2019   MCV 84.8 11/01/2019   PLT 392 11/01/2019   No results found for: NA, K, CL, CO2, BUN, CREATININE, GLUCOSE No results found for: INR, PROTIME  Assessment/Plan:  Estimated body mass index is 25.24 kg/m as calculated from the following:   Height as of 10/14/19: 5\' 8"  (1.727 m).   Weight as of 10/14/19: 75.3 kg. Patient admitted for L L4-5 decompression/ diskectomy. Patient has failed a reasonable attempt at conservative therapy.  I explained the condition and procedure to the patient and answered any questions.  Patient wishes to proceed with procedure as planned. Understands risks/ benefits and typical outcomes of procedure.   13/5/20 11/01/2019 10:58 AM

## 2019-11-01 NOTE — Anesthesia Procedure Notes (Signed)
Procedure Name: Intubation Date/Time: 11/01/2019 11:13 AM Performed by: Trinna Post., CRNA Pre-anesthesia Checklist: Patient identified, Emergency Drugs available, Suction available, Patient being monitored and Timeout performed Patient Re-evaluated:Patient Re-evaluated prior to induction Oxygen Delivery Method: Circle system utilized Preoxygenation: Pre-oxygenation with 100% oxygen Induction Type: IV induction and Rapid sequence Laryngoscope Size: Mac and 3 Grade View: Grade I Tube type: Oral Tube size: 7.0 mm Number of attempts: 1 Airway Equipment and Method: Stylet Placement Confirmation: ETT inserted through vocal cords under direct vision,  positive ETCO2 and breath sounds checked- equal and bilateral Secured at: 23 cm Tube secured with: Tape Dental Injury: Teeth and Oropharynx as per pre-operative assessment

## 2019-11-01 NOTE — Discharge Instructions (Signed)
Wound Care ° °Keep the incision clean and dry remove the outer dressing in 2 days, leave the Steri-Strips intact.  °Do not put any creams, lotions, or ointments on incision. °Leave steri-strips on back.  They will fall off by themselves. ° °Activity °Walk each and every day, increasing distance each day. °No lifting greater than 5 lbs.  °No lifting no bending no twisting no driving or riding a car unless coming back and forth to see me. °If provided with back brace, wear when out of bed.  It is not necessary to wear brace in bed. °Diet °Resume your normal diet.  ° °Return to Work °Will be discussed at you follow up appointment. ° °Call Your Doctor If Any of These Occur °Redness, drainage, or swelling at the wound.  °Temperature greater than 101 degrees. °Severe pain not relieved by pain medication. °Incision starts to come apart. °Follow Up Appt °Call today for appointment in 1-2 weeks ([phone]) or for problems.   ° °

## 2019-11-01 NOTE — Op Note (Signed)
11/01/2019  12:16 PM  PATIENT:  Kelly Mack  38 y.o. 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 Imaging showed large midline disc creation with spinal stenosis. The patient tried conservative measures without relief. Pain was debilitating. Recommended decompressive laminectomy followed by microdiscectomy. Patient understood the risks, benefits, and alternatives and potential outcomes and wished to proceed.  PROCEDURE DETAILS: The patient was taken to the operating room and after induction of adequate generalized endotracheal anesthesia, the patient was rolled into the prone position on the Wilson frame and all pressure points were padded. The lumbar region was cleaned and then prepped with DuraPrep and draped in the usual sterile fashion. 5 cc of local anesthesia was injected and then a dorsal midline incision was made and carried down to the lumbo sacral fascia. The fascia was opened and the paraspinous musculature was taken down in a subperiosteal fashion to expose L4-5 on the left. Intraoperative x-ray confirmed my level, and then I used a combination of the high-speed drill and the Kerrison punches to perform a hemilaminectomy, medial facetectomy, and foraminotomy at L4-5 on the left. The underlying yellow ligament was opened and removed in a piecemeal fashion to expose the underlying dura and exiting nerve root. I undercut the lateral recess and  dissected down until I was medial to and distal to the pedicle. The nerve root was well decompressed. We then gently retracted the nerve root medially with a retractor, coagulated the epidural venous vasculature, and incised the disc space. I performed a thorough intradiscal discectomy with pituitary rongeurs and curettes, until I had a nice decompression of the nerve root and the midline. I then palpated with a coronary dilator along the nerve root and into the foramen to assure adequate decompression. I felt no more compression of the nerve root. I irrigated with saline solution containing bacitracin. Achieved hemostasis with bipolar cautery, and then closed the fascia with 0 Vicryl. I closed the subcutaneous tissues with 2-0 Vicryl and the subcuticular tissues with 3-0 Vicryl. The skin was then closed with benzoin and Steri-Strips. The drapes were removed, a sterile dressing was applied.  My nurse practitioner was involved in the exposure, safe retraction of the neural elements, the disc work and the closure. the patient was awakened from general anesthesia and transferred to the recovery room in stable condition. At the end of the procedure all sponge, needle and instrument counts were correct.    PLAN OF CARE: Admit for overnight observation  PATIENT DISPOSITION:  PACU - hemodynamically stable.   Delay start of Pharmacological VTE agent (>24hrs) due to surgical blood loss or risk of bleeding:  yes

## 2019-11-01 NOTE — Anesthesia Preprocedure Evaluation (Addendum)
Anesthesia Evaluation  Patient identified by MRN, date of birth, ID band Patient awake    Reviewed: Allergy & Precautions, NPO status , Patient's Chart, lab work & pertinent test results  Airway Mallampati: II  TM Distance: >3 FB Neck ROM: Full    Dental  (+) Dental Advisory Given   Pulmonary neg pulmonary ROS, former smoker,    Pulmonary exam normal breath sounds clear to auscultation       Cardiovascular negative cardio ROS Normal cardiovascular exam Rhythm:Regular Rate:Normal     Neuro/Psych  Headaches, negative psych ROS   GI/Hepatic negative GI ROS, Neg liver ROS,   Endo/Other  negative endocrine ROS  Renal/GU negative Renal ROS     Musculoskeletal  (+) Arthritis ,   Abdominal   Peds  Hematology  (+) Blood dyscrasia, anemia ,   Anesthesia Other Findings   Reproductive/Obstetrics negative OB ROS                            Anesthesia Physical Anesthesia Plan  ASA: II  Anesthesia Plan: General   Post-op Pain Management:    Induction: Intravenous  PONV Risk Score and Plan: 4 or greater and Ondansetron, Dexamethasone, Treatment may vary due to age or medical condition, Scopolamine patch - Pre-op and Midazolam  Airway Management Planned: Oral ETT  Additional Equipment: None  Intra-op Plan:   Post-operative Plan: Extubation in OR  Informed Consent: I have reviewed the patients History and Physical, chart, labs and discussed the procedure including the risks, benefits and alternatives for the proposed anesthesia with the patient or authorized representative who has indicated his/her understanding and acceptance.     Dental advisory given  Plan Discussed with: CRNA  Anesthesia Plan Comments:         Anesthesia Quick Evaluation

## 2019-11-01 NOTE — Discharge Summary (Signed)
Physician Discharge Summary  Patient ID: Kelly Mack MRN: [id number] DOB/AGE: 1981/06/04 38 y.o.  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 The patient tolerated the procedure well and was taken to the recovery room and then to the floor in stable condition. The hospital course was routine. There were no complications. The wound remained clean dry and intact. Pt had appropriate back soreness. No complaints of leg pain or new N/T/W. The patient remained afebrile with stable vital signs, and tolerated a regular diet. The patient continued to increase activities, and pain was well controlled with oral pain medications.   Consults: None  Significant Diagnostic Studies:  Results for orders placed or performed during the hospital encounter of 11/01/19  Basic metabolic panel  Result Value Ref Range   Sodium 135 135 - 145 mmol/L   Potassium 3.7 3.5 - 5.1 mmol/L   Chloride 102 98 - 111 mmol/L   CO2 26 22 - 32 mmol/L   Glucose, Bld 90 70 - 99 mg/dL   BUN 13 6 - 20 mg/dL   Creatinine, Ser 4.09 0.44 - 1.00 mg/dL   Calcium 8.7 (L) 8.9 - 10.3 mg/dL   GFR calc non Af Amer >60 >60 mL/min   GFR calc Af Amer >60 >60 mL/min   Anion gap 7 5 - 15  CBC WITH DIFFERENTIAL  Result Value Ref Range   WBC 10.3 4.0 - 10.5 K/uL   RBC 4.14 3.87 - 5.11 MIL/uL   Hemoglobin 11.1 (L) 12.0 - 15.0 g/dL   HCT 81.1 (L) 91.4 - 78.2 %   MCV 84.8 80.0 - 100.0 fL   MCH 26.8 26.0 - 34.0 pg   MCHC 31.6 30.0 - 36.0 g/dL   RDW 95.6 21.3 - 08.6 %   Platelets 392 150 - 400 K/uL   nRBC 0.0 0.0 - 0.2 %   Neutrophils Relative % 72 %   Neutro Abs 7.4 1.7 - 7.7 K/uL   Lymphocytes Relative 16 %   Lymphs Abs 1.6  0.7 - 4.0 K/uL   Monocytes Relative 11 %   Monocytes Absolute 1.1 (H) 0.1 - 1.0 K/uL   Eosinophils Relative 1 %   Eosinophils Absolute 0.1 0.0 - 0.5 K/uL   Basophils Relative 0 %   Basophils Absolute 0.0 0.0 - 0.1 K/uL   Immature Granulocytes 0 %   Abs Immature Granulocytes 0.04 0.00 - 0.07 K/uL  Protime-INR  Result Value Ref Range   Prothrombin Time 12.0 11.4 - 15.2 seconds   INR 0.9 0.8 - 1.2  Pregnancy, urine POC  Result Value Ref Range   Preg Test, Ur NEGATIVE NEGATIVE    Chest 2 View  Result Date: 11/01/2019 CLINICAL DATA:  Preop lumbar spine surgery. EXAM: CHEST - 2 VIEW COMPARISON:  None. FINDINGS: Trachea is midline. Heart size normal. Lungs are clear. No pleural fluid. Mild levoconvex curvature of the thoracolumbar spine. IMPRESSION: No acute findings. Electronically Signed   By: Leanna Battles M.D.   On: 11/01/2019 10:52   Dg Lumbar Spine 2-3 Views  Result Date: 11/01/2019 CLINICAL DATA:  Localization images for patient undergoing lumbar surgery. EXAM: LUMBAR SPINE - 2-3 VIEW COMPARISON:  MRI lumbar spine 10/22/2019. FINDINGS: Two images of the lumbar  spine in lateral projection are provided. On the first image, a probe is at the level of the L4 pedicles. On the second image, a clamp is on the L4 and likely L5 spinous processes and a probe is at the level of L4-5. IMPRESSION: L4-5 localization. Electronically Signed   By: Drusilla Kanner M.D.   On: 11/01/2019 13:00   Mr Cervical Spine Wo Contrast  Result Date: 10/22/2019 CLINICAL DATA:  Initial evaluation for neck pain for 1 month, back pain with left hip pain and numbness, radiating into the left lower extremity. EXAM: MRI CERVICAL, THORACIC AND LUMBAR SPINE WITHOUT CONTRAST TECHNIQUE: Multiplanar and multiecho pulse sequences of the cervical spine, to include the craniocervical junction and cervicothoracic junction, and thoracic and lumbar spine, were obtained without intravenous contrast. COMPARISON:  Previous MRI of the  cervical spine from 12/25/2017. FINDINGS: MRI CERVICAL SPINE FINDINGS Alignment: Straightening with slight reversal of the normal cervical lordosis. No listhesis or subluxation. Vertebrae: Vertebral body height maintained without evidence for acute or chronic fracture. Bone marrow signal intensity within normal limits. No discrete or worrisome osseous lesions. No abnormal marrow edema. Cord: Signal intensity within the cervical spinal cord is normal. Mild prominence of the central canal noted within the lower cervical spine, stable. Posterior Fossa, vertebral arteries, paraspinal tissues: Visualized brain and posterior fossa within normal limits. Craniocervical junction normal. Paraspinous and prevertebral soft tissues are normal. Normal intravascular flow voids seen within the vertebral arteries bilaterally. Disc levels: C2-C3: Normal interspace. Mild left greater than right facet hypertrophy. No stenosis. C3-C4: Normal interspace. Mild left greater than right facet hypertrophy. No stenosis. C4-C5: Normal interspace. Mild left greater than right facet hypertrophy. No stenosis. C5-C6: Normal interspace. Mild bilateral facet hypertrophy. No stenosis. C6-C7: Normal interspace. Mild bilateral facet hypertrophy. No stenosis. C7-T1: Normal interspace. Mild bilateral facet hypertrophy. No stenosis. MRI THORACIC SPINE FINDINGS Alignment: Vertebral bodies normally aligned with preservation of the normal thoracic kyphosis. No listhesis. Vertebrae: Vertebral body height maintained without evidence for acute or chronic fracture. Bone marrow signal intensity within normal limits. Multiple scattered benign hemangiomata noted, most prominent of which measures 1 cm within the T7 vertebral body. No other discrete or worrisome osseous lesions. No abnormal marrow edema. 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 This measures up to 3 mm in maximal diameter at the level of T5 (series 9, image 16). Signal  intensity within the thoracic spinal cord is otherwise normal. Paraspinal and other soft tissues: Paraspinous soft tissues within normal limits. Visualized lungs are grossly clear. Visualized visceral structures unremarkable. Disc levels: T1-2:  Unremarkable. T2-3: Small left paracentral disc protrusion mildly indents the left ventral thecal sac. No stenosis or cord deformity. T3-4:  Unremarkable. T4-5: Normal interspace. Mild left-sided facet hypertrophy. No stenosis. T5-6:  Normal interspace.  Mild facet hypertrophy.  No stenosis. T6-7:  Normal interspace.  Minimal facet hypertrophy.  No stenosis. T7-8: Central disc protrusion indents the ventral thecal sac, contacting the ventral spinal cord. Associated slight superior migration. No significant stenosis or cord deformity. T8-9: Tiny right paracentral disc protrusion minimally indents the ventral thecal sac. Associated slight superior migration. Mild flattening of the ventral right hemi cord without cord signal changes. No significant stenosis. T9-10: Normal interspace.  Mild facet hypertrophy.  No stenosis. T10-11:  Normal interspace.  Mild facet hypertrophy.  No stenosis. T11-12: Shallow broad-based right paracentral disc protrusion mildly flattens the right ventral thecal sac. No significant stenosis or cord deformity. T12-L1:  Unremarkable. MRI LUMBAR SPINE FINDINGS Segmentation:  Standard. Lowest well-formed disc space labeled the L5-S1 level. Alignment: Physiologic with preservation of the normal lumbar lordosis. No listhesis. Vertebrae: Vertebral body height maintained without evidence for acute or chronic fracture. Bone marrow signal intensity within normal limits. Few scattered benign hemangiomata noted. Reactive endplate changes present about the L4-5 and L5-S1 interspace. No other abnormal marrow edema. Conus medullaris and cauda equina: Conus extends to the T12 level. Conus and cauda equina appear normal. Paraspinal and other soft tissues: Paraspinous  soft tissues within normal limits. Subcentimeter T1 hyperintense lesion present at the lower pole the left kidney, indeterminate, but suspected to reflect a small proteinaceous and/or hemorrhagic cyst. Visualized visceral structures otherwise unremarkable. Disc levels: L1-2:  Unremarkable. L2-3:  Unremarkable. L3-4:  Unremarkable. L4-5: Diffuse disc bulge with disc desiccation and intervertebral disc space narrowing. Mild reactive endplate changes. Superimposed central/left subarticular disc protrusion with inferior migration. Superimposed mild facet hypertrophy. Resultant moderate to severe canal with left worse than right lateral recess stenosis. Mild bilateral L4 foraminal narrowing, right worse than left. L5-S1: Chronic intervertebral disc space narrowing with diffuse disc bulge and disc desiccation. Superimposed reactive endplate changes. Central disc protrusion with slight caudad angulation, closely approximating the descending S1 nerve roots without frank neural impingement or displacement. Mild facet hypertrophy. Resultant mild bilateral lateral recess stenosis. Central canal remains patent. No significant foraminal stenosis. IMPRESSION: MRI CERVICAL SPINE IMPRESSION: 1. Multilevel mild cervical facet degeneration, similar to previous. 2. No significant disc pathology, stenosis, or evidence for neural impingement. MRI THORACIC SPINE IMPRESSION: 1. Multilevel disc protrusions at T2-3, T7-8, T8-9, and T11-12 as above. No significant stenosis. 2. 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 MRI LUMBAR SPINE IMPRESSION: 1. Central/left subarticular disc protrusion at L4-5 with resultant moderate to severe canal, with left worse than right lateral recess stenosis. Finding could contribute to left lower extremity radicular symptoms. 2. Shallow central disc protrusion at L5-S1, closely approximating both of the descending S1 nerve roots bilaterally without frank neural  impingement. Electronically Signed   By: Rise Mu M.D.   On: 10/22/2019 17:29   Mr Thoracic Spine Wo Contrast  Result Date: 10/22/2019 CLINICAL DATA:  Initial evaluation for neck pain for 1 month, back pain with left hip pain and numbness, radiating into the left lower extremity. EXAM: MRI CERVICAL, THORACIC AND LUMBAR SPINE WITHOUT CONTRAST TECHNIQUE: Multiplanar and multiecho pulse sequences of the cervical spine, to include the craniocervical junction and cervicothoracic junction, and thoracic and lumbar spine, were obtained without intravenous contrast. COMPARISON:  Previous MRI of the cervical spine from 12/25/2017. FINDINGS: MRI CERVICAL SPINE FINDINGS Alignment: Straightening with slight reversal of the normal cervical lordosis. No listhesis or subluxation. Vertebrae: Vertebral body height maintained without evidence for acute or chronic fracture. Bone marrow signal intensity within normal limits. No discrete or worrisome osseous lesions. No abnormal marrow edema. Cord: Signal intensity within the cervical spinal cord is normal. Mild prominence of the central canal noted within the lower cervical spine, stable. Posterior Fossa, vertebral arteries, paraspinal tissues: Visualized brain and posterior fossa within normal limits. Craniocervical junction normal. Paraspinous and prevertebral soft tissues are normal. Normal intravascular flow voids seen within the vertebral arteries bilaterally. Disc levels: C2-C3: Normal interspace. Mild left greater than right facet hypertrophy. No stenosis. C3-C4: Normal interspace. Mild left greater than right facet hypertrophy. No stenosis. C4-C5: Normal interspace. Mild left greater than right facet hypertrophy. No stenosis. C5-C6: Normal interspace. Mild bilateral facet hypertrophy. No stenosis. C6-C7: Normal interspace. Mild bilateral  facet hypertrophy. No stenosis. C7-T1: Normal interspace. Mild bilateral facet hypertrophy. No stenosis. MRI THORACIC SPINE  FINDINGS Alignment: Vertebral bodies normally aligned with preservation of the normal thoracic kyphosis. No listhesis. Vertebrae: Vertebral body height maintained without evidence for acute or chronic fracture. Bone marrow signal intensity within normal limits. Multiple scattered benign hemangiomata noted, most prominent of which measures 1 cm within the T7 vertebral body. No other discrete or worrisome osseous lesions. No abnormal marrow edema. 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 This measures up to 3 mm in maximal diameter at the level of T5 (series 9, image 16). Signal intensity within the thoracic spinal cord is otherwise normal. Paraspinal and other soft tissues: Paraspinous soft tissues within normal limits. Visualized lungs are grossly clear. Visualized visceral structures unremarkable. Disc levels: T1-2:  Unremarkable. T2-3: Small left paracentral disc protrusion mildly indents the left ventral thecal sac. No stenosis or cord deformity. T3-4:  Unremarkable. T4-5: Normal interspace. Mild left-sided facet hypertrophy. No stenosis. T5-6:  Normal interspace.  Mild facet hypertrophy.  No stenosis. T6-7:  Normal interspace.  Minimal facet hypertrophy.  No stenosis. T7-8: Central disc protrusion indents the ventral thecal sac, contacting the ventral spinal cord. Associated slight superior migration. No significant stenosis or cord deformity. T8-9: Tiny right paracentral disc protrusion minimally indents the ventral thecal sac. Associated slight superior migration. Mild flattening of the ventral right hemi cord without cord signal changes. No significant stenosis. T9-10: Normal interspace.  Mild facet hypertrophy.  No stenosis. T10-11:  Normal interspace.  Mild facet hypertrophy.  No stenosis. T11-12: Shallow broad-based right paracentral disc protrusion mildly flattens the right ventral thecal sac. No significant stenosis or cord deformity. T12-L1:  Unremarkable. MRI LUMBAR SPINE  FINDINGS Segmentation: Standard. Lowest well-formed disc space labeled the L5-S1 level. Alignment: Physiologic with preservation of the normal lumbar lordosis. No listhesis. Vertebrae: Vertebral body height maintained without evidence for acute or chronic fracture. Bone marrow signal intensity within normal limits. Few scattered benign hemangiomata noted. Reactive endplate changes present about the L4-5 and L5-S1 interspace. No other abnormal marrow edema. Conus medullaris and cauda equina: Conus extends to the T12 level. Conus and cauda equina appear normal. Paraspinal and other soft tissues: Paraspinous soft tissues within normal limits. Subcentimeter T1 hyperintense lesion present at the lower pole the left kidney, indeterminate, but suspected to reflect a small proteinaceous and/or hemorrhagic cyst. Visualized visceral structures otherwise unremarkable. Disc levels: L1-2:  Unremarkable. L2-3:  Unremarkable. L3-4:  Unremarkable. L4-5: Diffuse disc bulge with disc desiccation and intervertebral disc space narrowing. Mild reactive endplate changes. Superimposed central/left subarticular disc protrusion with inferior migration. Superimposed mild facet hypertrophy. Resultant moderate to severe canal with left worse than right lateral recess stenosis. Mild bilateral L4 foraminal narrowing, right worse than left. L5-S1: Chronic intervertebral disc space narrowing with diffuse disc bulge and disc desiccation. Superimposed reactive endplate changes. Central disc protrusion with slight caudad angulation, closely approximating the descending S1 nerve roots without frank neural impingement or displacement. Mild facet hypertrophy. Resultant mild bilateral lateral recess stenosis. Central canal remains patent. No significant foraminal stenosis. IMPRESSION: MRI CERVICAL SPINE IMPRESSION: 1. Multilevel mild cervical facet degeneration, similar to previous. 2. No significant disc pathology, stenosis, or evidence for neural  impingement. MRI THORACIC SPINE IMPRESSION: 1. Multilevel disc protrusions at T2-3, T7-8, T8-9, and T11-12 as above. No significant stenosis. 2. 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 MRI LUMBAR SPINE IMPRESSION: 1. Central/left subarticular disc protrusion at L4-5  with resultant moderate to severe canal, with left worse than right lateral recess stenosis. Finding could contribute to left lower extremity radicular symptoms. 2. Shallow central disc protrusion at L5-S1, closely approximating both of the descending S1 nerve roots bilaterally without frank neural impingement. Electronically Signed   By: Rise MuBenjamin  McClintock M.D.   On: 10/22/2019 17:29   Mr Lumbar Spine Wo Contrast  Result Date: 10/22/2019 CLINICAL DATA:  Initial evaluation for neck pain for 1 month, back pain with left hip pain and numbness, radiating into the left lower extremity. EXAM: MRI CERVICAL, THORACIC AND LUMBAR SPINE WITHOUT CONTRAST TECHNIQUE: Multiplanar and multiecho pulse sequences of the cervical spine, to include the craniocervical junction and cervicothoracic junction, and thoracic and lumbar spine, were obtained without intravenous contrast. COMPARISON:  Previous MRI of the cervical spine from 12/25/2017. FINDINGS: MRI CERVICAL SPINE FINDINGS Alignment: Straightening with slight reversal of the normal cervical lordosis. No listhesis or subluxation. Vertebrae: Vertebral body height maintained without evidence for acute or chronic fracture. Bone marrow signal intensity within normal limits. No discrete or worrisome osseous lesions. No abnormal marrow edema. Cord: Signal intensity within the cervical spinal cord is normal. Mild prominence of the central canal noted within the lower cervical spine, stable. Posterior Fossa, vertebral arteries, paraspinal tissues: Visualized brain and posterior fossa within normal limits. Craniocervical junction normal. Paraspinous and prevertebral soft  tissues are normal. Normal intravascular flow voids seen within the vertebral arteries bilaterally. Disc levels: C2-C3: Normal interspace. Mild left greater than right facet hypertrophy. No stenosis. C3-C4: Normal interspace. Mild left greater than right facet hypertrophy. No stenosis. C4-C5: Normal interspace. Mild left greater than right facet hypertrophy. No stenosis. C5-C6: Normal interspace. Mild bilateral facet hypertrophy. No stenosis. C6-C7: Normal interspace. Mild bilateral facet hypertrophy. No stenosis. C7-T1: Normal interspace. Mild bilateral facet hypertrophy. No stenosis. MRI THORACIC SPINE FINDINGS Alignment: Vertebral bodies normally aligned with preservation of the normal thoracic kyphosis. No listhesis. Vertebrae: Vertebral body height maintained without evidence for acute or chronic fracture. Bone marrow signal intensity within normal limits. Multiple scattered benign hemangiomata noted, most prominent of which measures 1 cm within the T7 vertebral body. No other discrete or worrisome osseous lesions. No abnormal marrow edema. 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 This measures up to 3 mm in maximal diameter at the level of T5 (series 9, image 16). Signal intensity within the thoracic spinal cord is otherwise normal. Paraspinal and other soft tissues: Paraspinous soft tissues within normal limits. Visualized lungs are grossly clear. Visualized visceral structures unremarkable. Disc levels: T1-2:  Unremarkable. T2-3: Small left paracentral disc protrusion mildly indents the left ventral thecal sac. No stenosis or cord deformity. T3-4:  Unremarkable. T4-5: Normal interspace. Mild left-sided facet hypertrophy. No stenosis. T5-6:  Normal interspace.  Mild facet hypertrophy.  No stenosis. T6-7:  Normal interspace.  Minimal facet hypertrophy.  No stenosis. T7-8: Central disc protrusion indents the ventral thecal sac, contacting the ventral spinal cord. Associated slight  superior migration. No significant stenosis or cord deformity. T8-9: Tiny right paracentral disc protrusion minimally indents the ventral thecal sac. Associated slight superior migration. Mild flattening of the ventral right hemi cord without cord signal changes. No significant stenosis. T9-10: Normal interspace.  Mild facet hypertrophy.  No stenosis. T10-11:  Normal interspace.  Mild facet hypertrophy.  No stenosis. T11-12: Shallow broad-based right paracentral disc protrusion mildly flattens the right ventral thecal sac. No significant stenosis or cord deformity. T12-L1:  Unremarkable. MRI LUMBAR SPINE FINDINGS Segmentation: Standard. Lowest well-formed  disc space labeled the L5-S1 level. Alignment: Physiologic with preservation of the normal lumbar lordosis. No listhesis. Vertebrae: Vertebral body height maintained without evidence for acute or chronic fracture. Bone marrow signal intensity within normal limits. Few scattered benign hemangiomata noted. Reactive endplate changes present about the L4-5 and L5-S1 interspace. No other abnormal marrow edema. Conus medullaris and cauda equina: Conus extends to the T12 level. Conus and cauda equina appear normal. Paraspinal and other soft tissues: Paraspinous soft tissues within normal limits. Subcentimeter T1 hyperintense lesion present at the lower pole the left kidney, indeterminate, but suspected to reflect a small proteinaceous and/or hemorrhagic cyst. Visualized visceral structures otherwise unremarkable. Disc levels: L1-2:  Unremarkable. L2-3:  Unremarkable. L3-4:  Unremarkable. L4-5: Diffuse disc bulge with disc desiccation and intervertebral disc space narrowing. Mild reactive endplate changes. Superimposed central/left subarticular disc protrusion with inferior migration. Superimposed mild facet hypertrophy. Resultant moderate to severe canal with left worse than right lateral recess stenosis. Mild bilateral L4 foraminal narrowing, right worse than left.  L5-S1: Chronic intervertebral disc space narrowing with diffuse disc bulge and disc desiccation. Superimposed reactive endplate changes. Central disc protrusion with slight caudad angulation, closely approximating the descending S1 nerve roots without frank neural impingement or displacement. Mild facet hypertrophy. Resultant mild bilateral lateral recess stenosis. Central canal remains patent. No significant foraminal stenosis. IMPRESSION: MRI CERVICAL SPINE IMPRESSION: 1. Multilevel mild cervical facet degeneration, similar to previous. 2. No significant disc pathology, stenosis, or evidence for neural impingement. MRI THORACIC SPINE IMPRESSION: 1. Multilevel disc protrusions at T2-3, T7-8, T8-9, and T11-12 as above. No significant stenosis. 2. 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 MRI LUMBAR SPINE IMPRESSION: 1. Central/left subarticular disc protrusion at L4-5 with resultant moderate to severe canal, with left worse than right lateral recess stenosis. Finding could contribute to left lower extremity radicular symptoms. 2. Shallow central disc protrusion at L5-S1, closely approximating both of the descending S1 nerve roots bilaterally without frank neural impingement. Electronically Signed   By: Jeannine Boga M.D.   On: 10/22/2019 17:29   Dg Hip Unilat With Pelvis 2-3 Views Left  Result Date: 10/15/2019 CLINICAL DATA:  Left hip pain for 3 weeks, no known injury EXAM: DG HIP (WITH OR WITHOUT PELVIS) 2-3V LEFT COMPARISON:  Radiographs 01/12/2018 FINDINGS: There is no evidence of hip fracture or dislocation. Bones of the pelvis are congruent. No suspicious osseous lesions. No significant arthrosis or degenerative change. IMPRESSION: Unremarkable left hip radiographs. Electronically Signed   By: Lovena Le M.D.   On: 10/15/2019 21:11    Antibiotics:  Anti-infectives (From admission, onward)   Start     Dose/Rate Route Frequency Ordered Stop   11/01/19  1930  ceFAZolin (ANCEF) IVPB 2g/100 mL premix     2 g 200 mL/hr over 30 Minutes Intravenous Every 8 hours 11/01/19 1344 11/02/19 1129   11/01/19 1134  bacitracin 50,000 Units in sodium chloride 0.9 % 500 mL irrigation  Status:  Discontinued       As needed 11/01/19 1135 11/01/19 1219   11/01/19 1015  ceFAZolin (ANCEF) IVPB 2g/100 mL premix     2 g 200 mL/hr over 30 Minutes Intravenous On call to O.R. 11/01/19 1013 11/01/19 1144      Discharge Exam: Blood pressure (!) 135/57, pulse 89, temperature 98 F (36.7 C), temperature source Oral, resp. rate 20, height 5\' 8"  (1.727 m), weight 75.3 kg, last menstrual period 10/12/2019, SpO2 99 %. Neurologic: Grossly normal Ambulating and voiding  well  Discharge Medications:   Allergies as of 11/01/2019   No Known Allergies     Medication List    TAKE these medications   gabapentin 600 MG tablet Commonly known as: NEURONTIN Take 600 mg by mouth 4 (four) times daily.   ibuprofen 800 MG tablet Commonly known as: ADVIL Take 800 mg by mouth 3 (three) times daily with meals.   PARoxetine 10 MG tablet Commonly known as: PAXIL Take 10 mg by mouth at bedtime.   Percocet 10-325 MG tablet Generic drug: oxyCODONE-acetaminophen Take 1 tablet by mouth every 4 (four) hours.   tiZANidine 2 MG tablet Commonly known as: ZANAFLEX Take 2 mg by mouth at bedtime.   traMADol 50 MG tablet Commonly known as: ULTRAM Take 100 mg by mouth every 8 (eight) hours as needed for pain.   traZODone 50 MG tablet Commonly known as: DESYREL Take 100 mg by mouth at bedtime.       Disposition: home   Final Dx: left l4-5 microdiscectomy  Discharge Instructions     Remove dressing in 72 hours   Complete by: As directed    Call MD for:  difficulty breathing, headache or visual disturbances   Complete by: As directed    Call MD for:  persistant nausea and vomiting   Complete by: As directed    Call MD for:  redness, tenderness, or signs of infection  (pain, swelling, redness, odor or green/yellow discharge around incision site)   Complete by: As directed    Call MD for:  severe uncontrolled pain   Complete by: As directed    Call MD for:  temperature >100.4   Complete by: As directed    Diet - low sodium heart healthy   Complete by: As directed    Increase activity slowly   Complete by: As directed       Follow-up Information    Tia AlertJones, David S, MD. Schedule an appointment as soon as possible for a visit in 2 week(s).   Specialty: Neurosurgery Contact information: [address] [place] [address] [phone]            Signed: Tiana LoftKimberly Hannah [facility] 11/01/2019, 4:14 PM

## 2019-11-02 ENCOUNTER — Encounter (HOSPITAL_COMMUNITY): Payer: Self-pay | Admitting: Neurological Surgery

## 2019-11-02 NOTE — Anesthesia Postprocedure Evaluation (Signed)
Anesthesia Post Note  Patient: Kelly Mack  Procedure(s) Performed: Microdiscectomy - Lumbar Four-Lumbar Five - left (Left Back)     Patient location during evaluation: PACU Anesthesia Type: General Level of consciousness: sedated and patient cooperative Pain management: pain level controlled Vital Signs Assessment: post-procedure vital signs reviewed and stable Respiratory status: spontaneous breathing Cardiovascular status: stable Anesthetic complications: no    Last Vitals:  Vitals:   11/01/19 1324 11/01/19 1348  BP: 131/72 (!) 135/57  Pulse: 82 89  Resp: 16 20  Temp: 36.5 C 36.7 C  SpO2: 100% 99%    Last Pain:  Vitals:   11/01/19 1625  TempSrc:   PainSc: [place]

## 2020-01-04 ENCOUNTER — Other Ambulatory Visit (HOSPITAL_COMMUNITY): Payer: Self-pay | Admitting: Neurology

## 2020-01-04 DIAGNOSIS — M542 Cervicalgia: Secondary | ICD-10-CM

## 2020-01-06 ENCOUNTER — Other Ambulatory Visit: Payer: Self-pay

## 2020-01-06 ENCOUNTER — Ambulatory Visit (HOSPITAL_COMMUNITY)
Admission: RE | Admit: 2020-01-06 | Discharge: 2020-01-06 | Disposition: A | Discharge status: Home / Self Care | Payer: Medicaid Other | Source: Ambulatory Visit | Attending: Neurology | Admitting: Neurology

## 2020-01-06 DIAGNOSIS — M542 Cervicalgia: Secondary | ICD-10-CM | POA: Insufficient documentation

## 2020-02-03 ENCOUNTER — Other Ambulatory Visit: Payer: Self-pay | Admitting: Neurology

## 2020-02-03 ENCOUNTER — Other Ambulatory Visit (HOSPITAL_COMMUNITY): Payer: Self-pay | Admitting: Neurology

## 2020-02-03 DIAGNOSIS — I679 Cerebrovascular disease, unspecified: Secondary | ICD-10-CM

## 2020-02-03 DIAGNOSIS — R413 Other amnesia: Secondary | ICD-10-CM

## 2020-02-29 ENCOUNTER — Ambulatory Visit (HOSPITAL_COMMUNITY): Payer: Medicaid Other

## 2020-03-07 ENCOUNTER — Ambulatory Visit (HOSPITAL_COMMUNITY): Payer: Medicaid Other

## 2020-04-05 ENCOUNTER — Other Ambulatory Visit: Payer: Self-pay

## 2020-04-05 ENCOUNTER — Ambulatory Visit (HOSPITAL_COMMUNITY)
Admission: RE | Admit: 2020-04-05 | Discharge: 2020-04-05 | Disposition: A | Discharge status: Home / Self Care | Payer: Medicaid Other | Source: Ambulatory Visit | Attending: Neurology | Admitting: Neurology

## 2020-04-05 DIAGNOSIS — I679 Cerebrovascular disease, unspecified: Secondary | ICD-10-CM | POA: Insufficient documentation

## 2020-04-05 DIAGNOSIS — R413 Other amnesia: Secondary | ICD-10-CM | POA: Diagnosis present

## 2020-10-27 IMAGING — MR MR CERVICAL SPINE W/O CM
4 of 5 series · 19 of 48 positions shown · non-contrast
Comparison: Previous MRI of the cervical spine from 12/25/2017.

CLINICAL DATA: Initial evaluation for neck pain for 1 month, back
pain with left hip pain and numbness, radiating into the left lower
extremity.

EXAM:
MRI CERVICAL, THORACIC AND LUMBAR SPINE WITHOUT CONTRAST
TECHNIQUE: Multiplanar and multiecho pulse sequences of the cervical spine, to
include the craniocervical junction and cervicothoracic junction,
and thoracic and lumbar spine, were obtained without intravenous
contrast.

[Series 3: T2 · sagittal · 3.0mm · 0.63mm/px · 6 of 15 slices shown (1 of 2)]
[im 1/15]
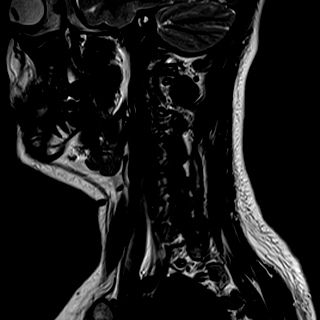
[im 3/15]
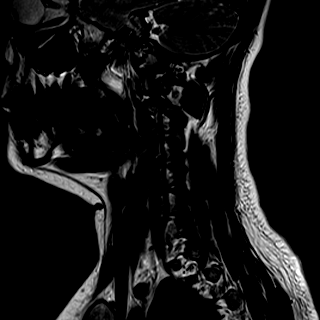
[im 6/15]
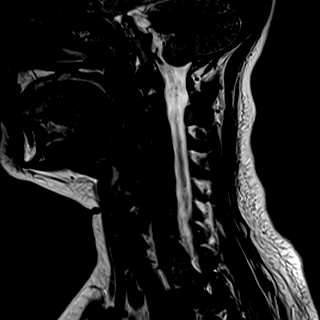
[im 9/15]
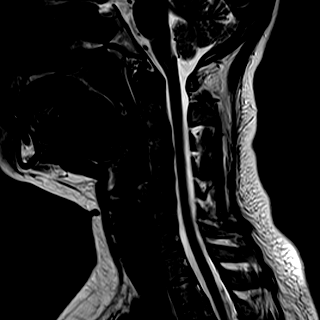
[im 12/15]
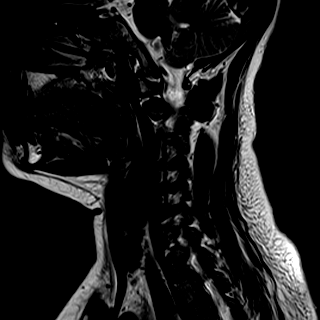
[im 15/15]
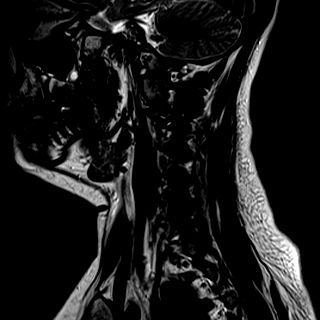

[Series 4: FLAIR · sagittal · 3.0mm · 0.63mm/px · 3 of 15 slices shown]
[im 3/15]
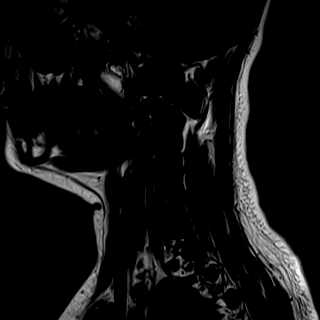
[im 8/15]
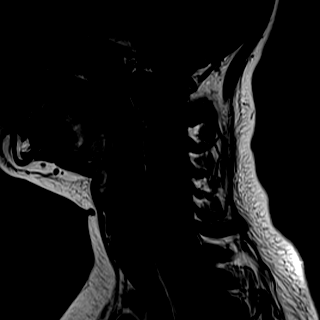
[im 12/15]
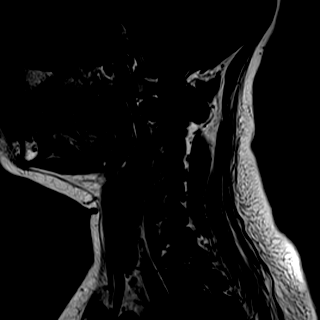

[Series 5: STIR · sagittal · 3.0mm · 0.31mm/px · 3 of 15 slices shown]
[im 3/15]
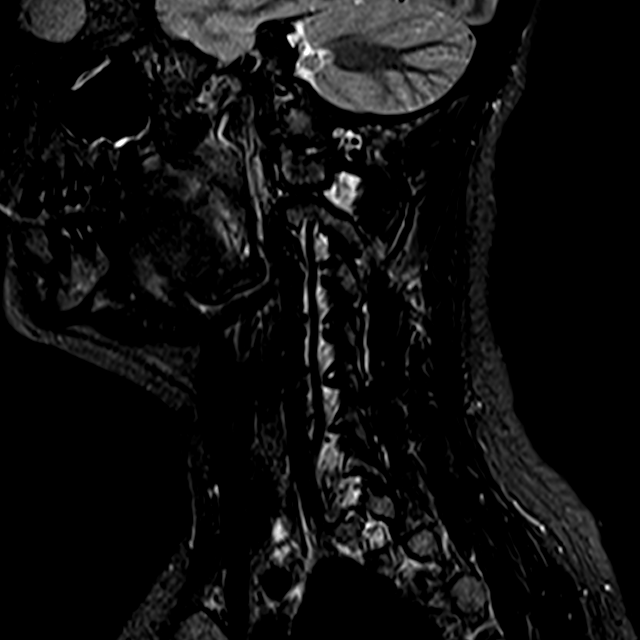
[im 8/15]
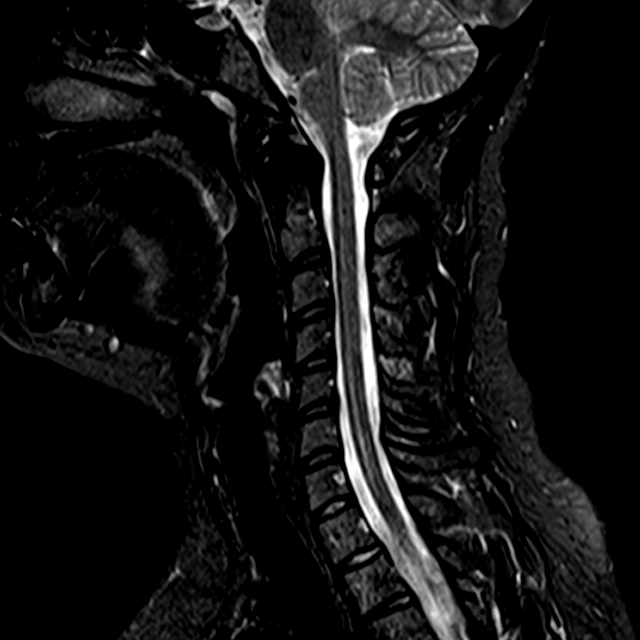
[im 12/15]
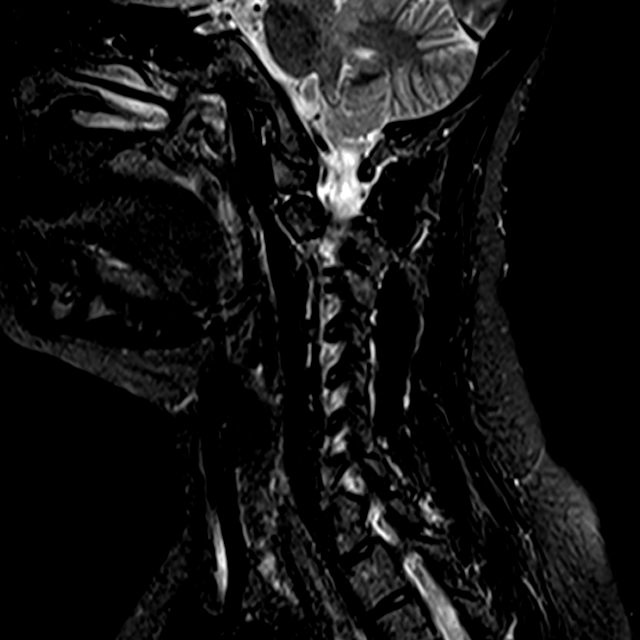

[Series 6: T2 · axial · 3.0mm · 0.27mm/px · z∈[+18,+102]mm · 7 of 32 slices shown (2 of 2)]
[im 1/32]
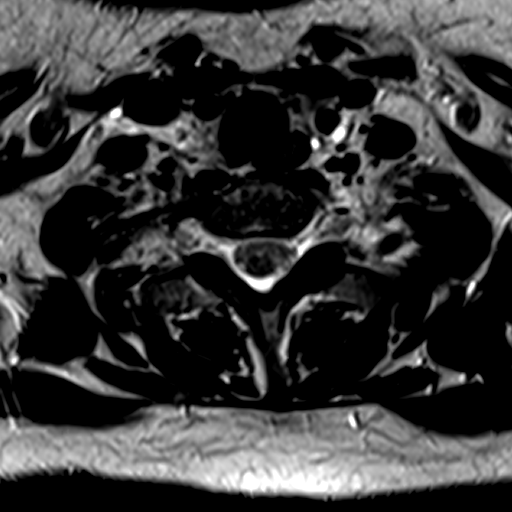
[im 5/32]
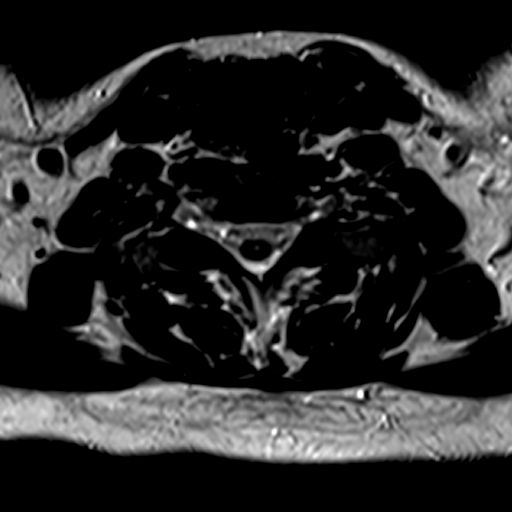
[im 10/32]
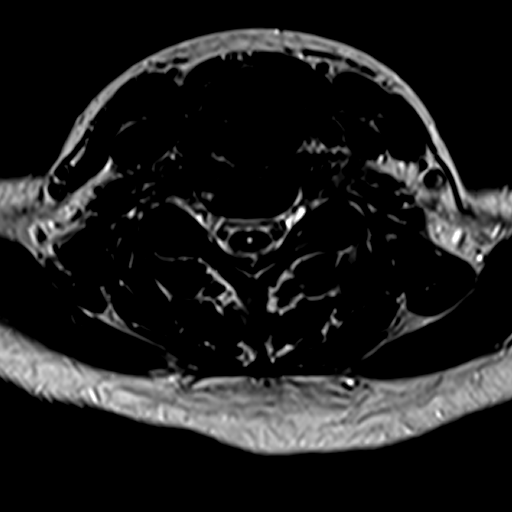
[im 15/32]
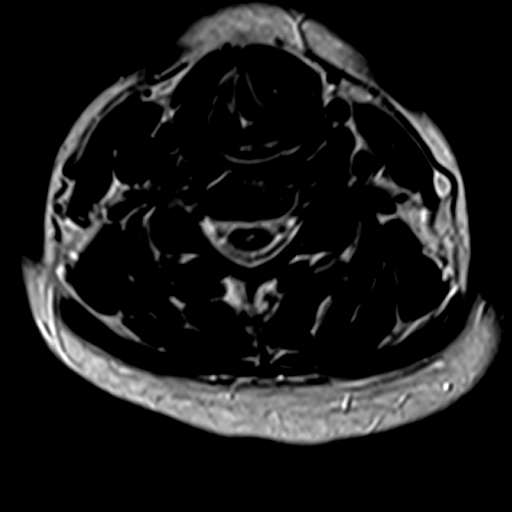
[im 17/32]
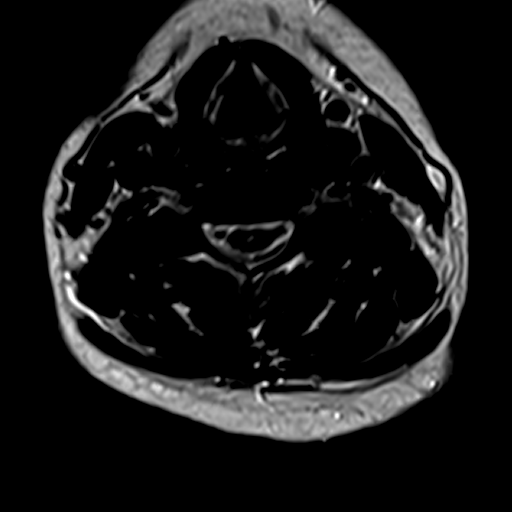
[im 22/32]
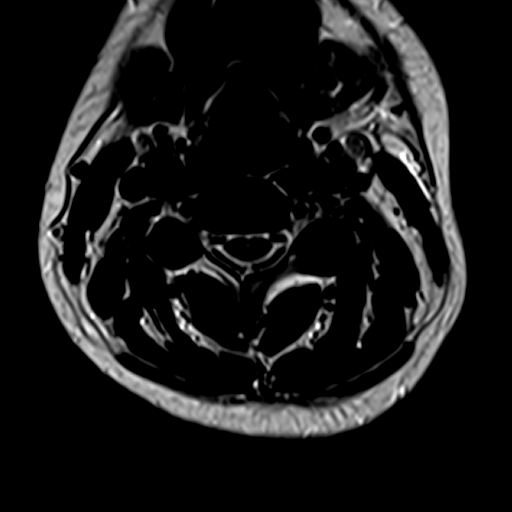
[im 27/32]
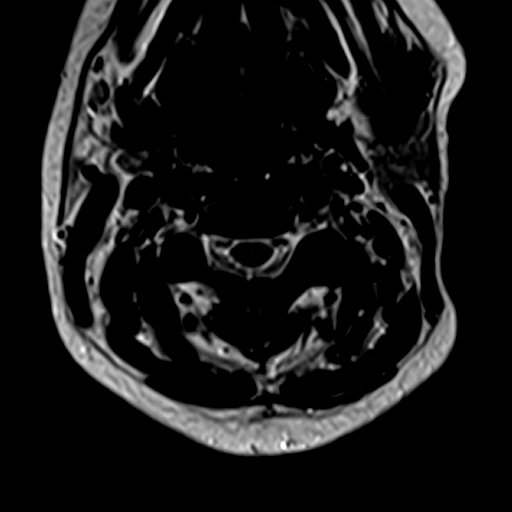

[19 of 48 positions shown; findings below may reference images not displayed]

FINDINGS: MRI CERVICAL SPINE FINDINGS

Alignment: Straightening with slight reversal of the normal cervical
lordosis. No listhesis or subluxation.

Vertebrae: Vertebral body height maintained without evidence for
acute or chronic fracture. Bone marrow signal intensity within
normal limits. No discrete or worrisome osseous lesions. No abnormal
marrow edema.

Cord: Signal intensity within the cervical spinal cord is normal.
Mild prominence of the central canal noted within the lower cervical
spine, stable.

Posterior Fossa, vertebral arteries, paraspinal tissues: Visualized
brain and posterior fossa within normal limits. Craniocervical
junction normal. Paraspinous and prevertebral soft tissues are
normal. Normal intravascular flow voids seen within the vertebral
arteries bilaterally.

Disc levels:

C2-C3: Normal interspace. Mild left greater than right facet
hypertrophy. No stenosis.

C3-C4: Normal interspace. Mild left greater than right facet
hypertrophy. No stenosis.

C4-C5: Normal interspace. Mild left greater than right facet
hypertrophy. No stenosis.

C5-C6: Normal interspace. Mild bilateral facet hypertrophy. No
stenosis.

C6-C7: Normal interspace. Mild bilateral facet hypertrophy. No
stenosis.

C7-T1: Normal interspace. Mild bilateral facet hypertrophy. No
stenosis.

MRI THORACIC SPINE FINDINGS

Alignment: Vertebral bodies normally aligned with preservation of
the normal thoracic kyphosis. No listhesis.

Vertebrae: Vertebral body height maintained without evidence for
acute or chronic fracture. Bone marrow signal intensity within
normal limits. Multiple scattered benign hemangiomata noted, most
prominent of which measures 1 cm within the T7 vertebral body. No
other discrete or worrisome osseous lesions. No abnormal marrow
edema.

Cord: Syringomyelia extending from approximately T4 through T7 is
seen. This measures up to 3 mm in maximal diameter at the level of
T5 (series 9, image 16). Signal intensity within the thoracic spinal
cord is otherwise normal.

Paraspinal and other soft tissues: Paraspinous soft tissues within
normal limits. Visualized lungs are grossly clear. Visualized
visceral structures unremarkable.

Disc levels:

T1-2:  Unremarkable.

T2-3: Small left paracentral disc protrusion mildly indents the left
ventral thecal sac. No stenosis or cord deformity.

T3-4:  Unremarkable.

T4-5: Normal interspace. Mild left-sided facet hypertrophy. No
stenosis.

T5-6:  Normal interspace.  Mild facet hypertrophy.  No stenosis.

T6-7:  Normal interspace.  Minimal facet hypertrophy.  No stenosis.

T7-8: Central disc protrusion indents the ventral thecal sac,
contacting the ventral spinal cord. Associated slight superior
migration. No significant stenosis or cord deformity.

T8-9: Tiny right paracentral disc protrusion minimally indents the
ventral thecal sac. Associated slight superior migration. Mild
flattening of the ventral right hemi cord without cord signal
changes. No significant stenosis.

T9-10: Normal interspace.  Mild facet hypertrophy.  No stenosis.

T10-11:  Normal interspace.  Mild facet hypertrophy.  No stenosis.

T11-12: Shallow broad-based right paracentral disc protrusion mildly
flattens the right ventral thecal sac. No significant stenosis or
cord deformity.

T12-L1:  Unremarkable.

MRI LUMBAR SPINE FINDINGS

Segmentation: Standard. Lowest well-formed disc space labeled the
L5-S1 level.

Alignment: Physiologic with preservation of the normal lumbar
lordosis. No listhesis.

Vertebrae: Vertebral body height maintained without evidence for
acute or chronic fracture. Bone marrow signal intensity within
normal limits. Few scattered benign hemangiomata noted. Reactive
endplate changes present about the L4-5 and L5-S1 interspace. No
other abnormal marrow edema.

Conus medullaris and cauda equina: Conus extends to the T12 level.
Conus and cauda equina appear normal.

Paraspinal and other soft tissues: Paraspinous soft tissues within
normal limits. Subcentimeter T1 hyperintense lesion present at the
lower pole the left kidney, indeterminate, but suspected to reflect
a small proteinaceous and/or hemorrhagic cyst. Visualized visceral
structures otherwise unremarkable.

Disc levels:

L1-2:  Unremarkable.

L2-3:  Unremarkable.

L3-4:  Unremarkable.

L4-5: Diffuse disc bulge with disc desiccation and intervertebral
disc space narrowing. Mild reactive endplate changes. Superimposed
central/left subarticular disc protrusion with inferior migration.
Superimposed mild facet hypertrophy. Resultant moderate to severe
canal with left worse than right lateral recess stenosis. Mild
bilateral L4 foraminal narrowing, right worse than left.

L5-S1: Chronic intervertebral disc space narrowing with diffuse disc
bulge and disc desiccation. Superimposed reactive endplate changes.
Central disc protrusion with slight caudad angulation, closely
approximating the descending S1 nerve roots without frank neural
impingement or displacement. Mild facet hypertrophy. Resultant mild
bilateral lateral recess stenosis. Central canal remains patent. No
significant foraminal stenosis.
IMPRESSION: MRI CERVICAL SPINE IMPRESSION:

1. Multilevel mild cervical facet degeneration, similar to previous.
2. No significant disc pathology, stenosis, or evidence for neural
impingement.

MRI THORACIC SPINE IMPRESSION:

1. Multilevel disc protrusions at T2-3, T7-8, T8-9, and T11-12 as
above. No significant stenosis.
2. Syringomyelia extending from T4 through T7, measuring up to 3 mm
in maximal diameter.

MRI LUMBAR SPINE IMPRESSION:

1. Central/left subarticular disc protrusion at L4-5 with resultant
moderate to severe canal, with left worse than right lateral recess
stenosis. Finding could contribute to left lower extremity radicular
symptoms.
2. Shallow central disc protrusion at L5-S1, closely approximating
both of the descending S1 nerve roots bilaterally without frank
neural impingement.

## 2020-10-27 IMAGING — MR MR LUMBAR SPINE W/O CM
4 of 5 series · 11 of 48 positions shown · non-contrast
Comparison: Previous MRI of the cervical spine from 12/25/2017.

CLINICAL DATA: Initial evaluation for neck pain for 1 month, back
pain with left hip pain and numbness, radiating into the left lower
extremity.

EXAM:
MRI CERVICAL, THORACIC AND LUMBAR SPINE WITHOUT CONTRAST
TECHNIQUE: Multiplanar and multiecho pulse sequences of the cervical spine, to
include the craniocervical junction and cervicothoracic junction,
and thoracic and lumbar spine, were obtained without intravenous
contrast.

[Series 2: T2 · sagittal · 4.0mm · 0.66mm/px · 3 of 13 slices shown (1 of 2)]
[im 3/13]
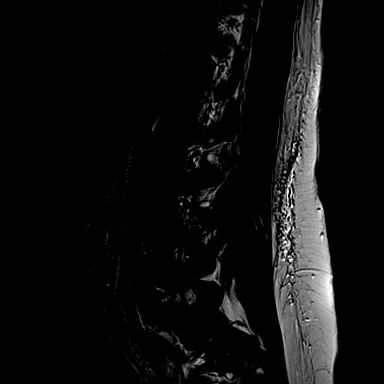
[im 8/13]
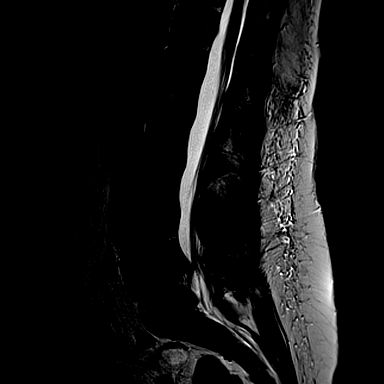
[im 13/13]
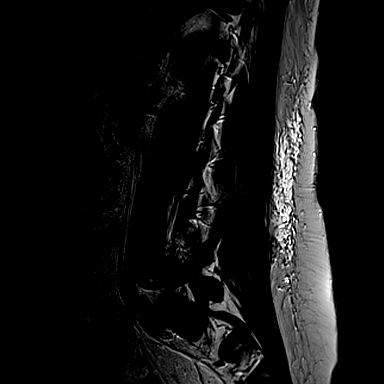

[Series 3: T1 · sagittal · 4.0mm · 0.33mm/px · 3 of 13 slices shown (1 of 2)]
[im 1/13]
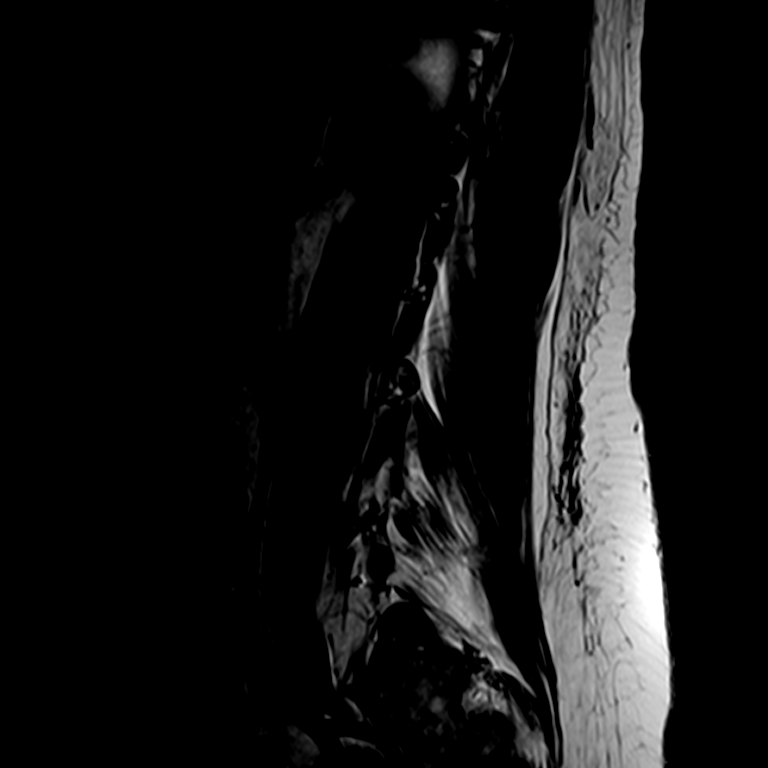
[im 7/13]
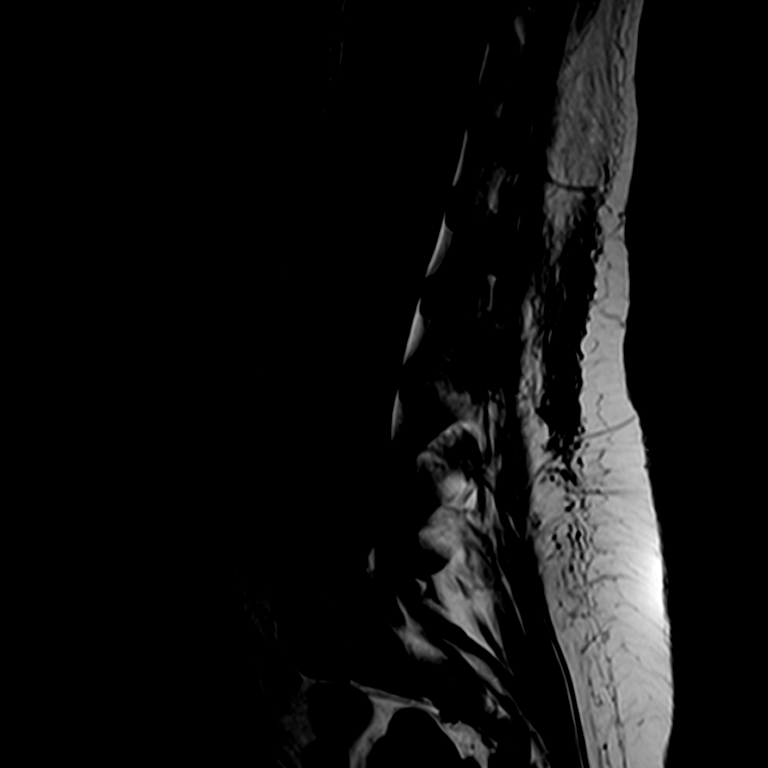
[im 13/13]
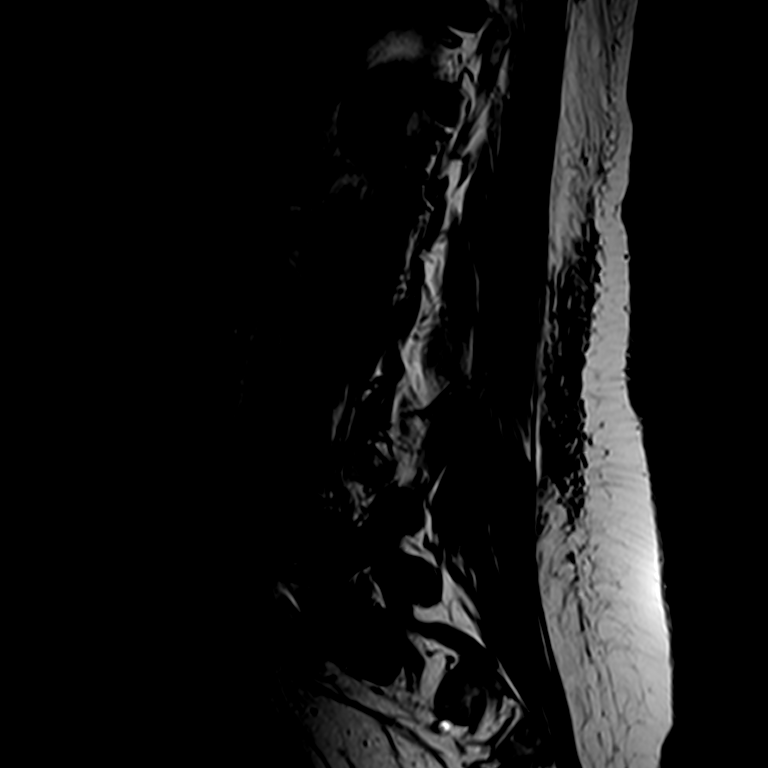

[Series 5: T2 · axial · 4.0mm · 0.26mm/px · z∈[-392,-252]mm · 3 of 39 slices shown (2 of 2)]
[im 6/39]
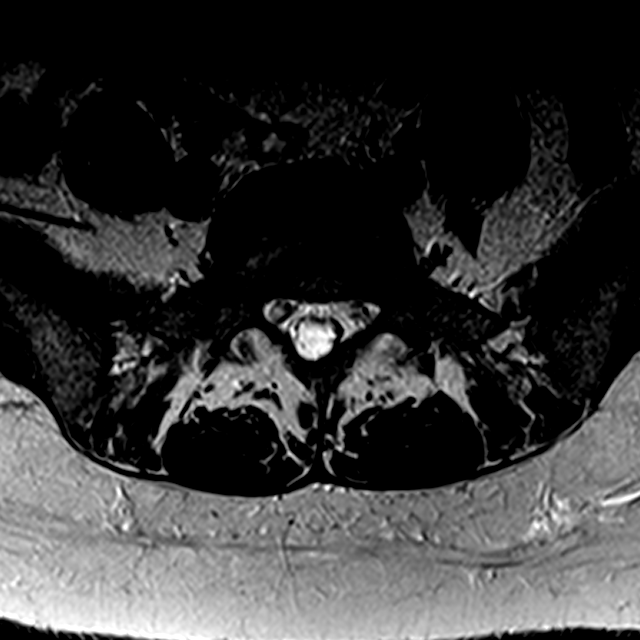
[im 21/39]
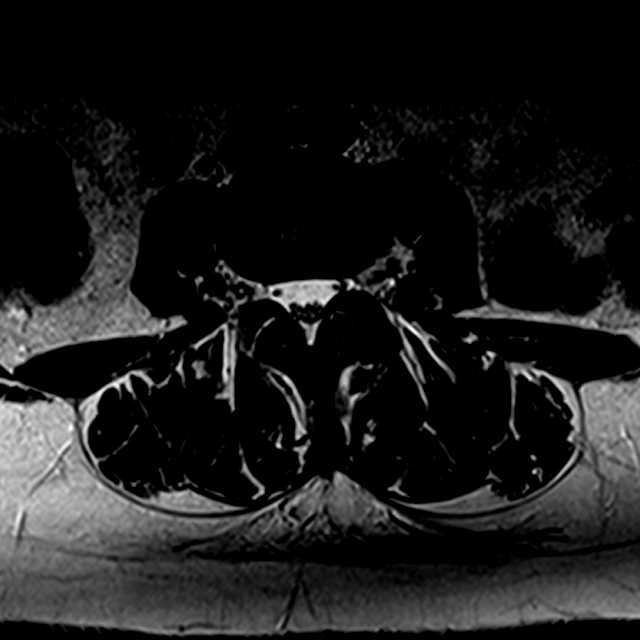
[im 33/39]
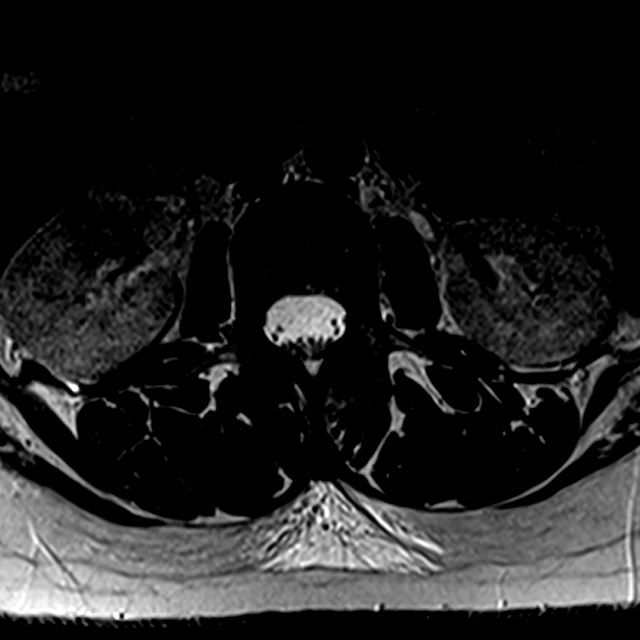

[Series 6: T1 · axial · 4.0mm · 0.26mm/px · z∈[-392,-305]mm · 2 of 39 slices shown (2 of 2)]
[im 6/39]
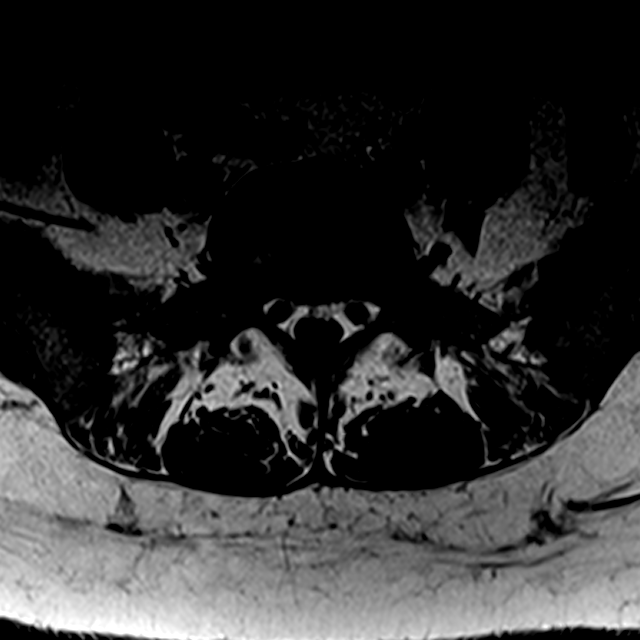
[im 21/39]
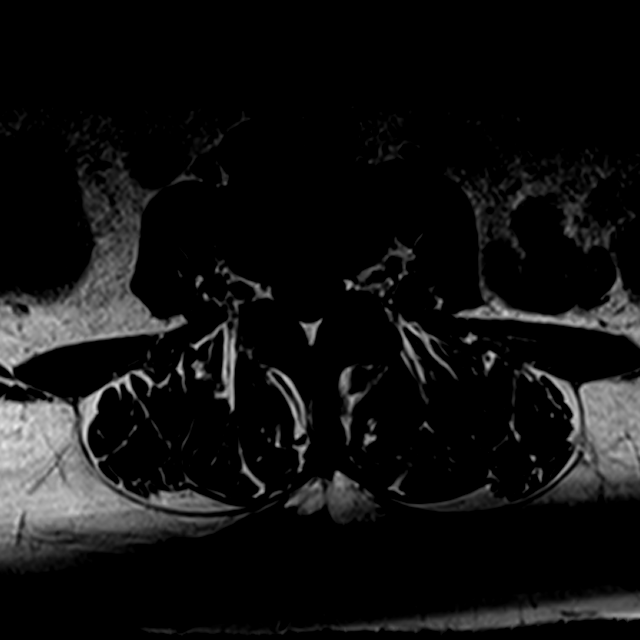

[11 of 48 positions shown; findings below may reference images not displayed]

FINDINGS: MRI CERVICAL SPINE FINDINGS

Alignment: Straightening with slight reversal of the normal cervical
lordosis. No listhesis or subluxation.

Vertebrae: Vertebral body height maintained without evidence for
acute or chronic fracture. Bone marrow signal intensity within
normal limits. No discrete or worrisome osseous lesions. No abnormal
marrow edema.

Cord: Signal intensity within the cervical spinal cord is normal.
Mild prominence of the central canal noted within the lower cervical
spine, stable.

Posterior Fossa, vertebral arteries, paraspinal tissues: Visualized
brain and posterior fossa within normal limits. Craniocervical
junction normal. Paraspinous and prevertebral soft tissues are
normal. Normal intravascular flow voids seen within the vertebral
arteries bilaterally.

Disc levels:

C2-C3: Normal interspace. Mild left greater than right facet
hypertrophy. No stenosis.

C3-C4: Normal interspace. Mild left greater than right facet
hypertrophy. No stenosis.

C4-C5: Normal interspace. Mild left greater than right facet
hypertrophy. No stenosis.

C5-C6: Normal interspace. Mild bilateral facet hypertrophy. No
stenosis.

C6-C7: Normal interspace. Mild bilateral facet hypertrophy. No
stenosis.

C7-T1: Normal interspace. Mild bilateral facet hypertrophy. No
stenosis.

MRI THORACIC SPINE FINDINGS

Alignment: Vertebral bodies normally aligned with preservation of
the normal thoracic kyphosis. No listhesis.

Vertebrae: Vertebral body height maintained without evidence for
acute or chronic fracture. Bone marrow signal intensity within
normal limits. Multiple scattered benign hemangiomata noted, most
prominent of which measures 1 cm within the T7 vertebral body. No
other discrete or worrisome osseous lesions. No abnormal marrow
edema.

Cord: Syringomyelia extending from approximately T4 through T7 is
seen. This measures up to 3 mm in maximal diameter at the level of
T5 (series 9, image 16). Signal intensity within the thoracic spinal
cord is otherwise normal.

Paraspinal and other soft tissues: Paraspinous soft tissues within
normal limits. Visualized lungs are grossly clear. Visualized
visceral structures unremarkable.

Disc levels:

T1-2:  Unremarkable.

T2-3: Small left paracentral disc protrusion mildly indents the left
ventral thecal sac. No stenosis or cord deformity.

T3-4:  Unremarkable.

T4-5: Normal interspace. Mild left-sided facet hypertrophy. No
stenosis.

T5-6:  Normal interspace.  Mild facet hypertrophy.  No stenosis.

T6-7:  Normal interspace.  Minimal facet hypertrophy.  No stenosis.

T7-8: Central disc protrusion indents the ventral thecal sac,
contacting the ventral spinal cord. Associated slight superior
migration. No significant stenosis or cord deformity.

T8-9: Tiny right paracentral disc protrusion minimally indents the
ventral thecal sac. Associated slight superior migration. Mild
flattening of the ventral right hemi cord without cord signal
changes. No significant stenosis.

T9-10: Normal interspace.  Mild facet hypertrophy.  No stenosis.

T10-11:  Normal interspace.  Mild facet hypertrophy.  No stenosis.

T11-12: Shallow broad-based right paracentral disc protrusion mildly
flattens the right ventral thecal sac. No significant stenosis or
cord deformity.

T12-L1:  Unremarkable.

MRI LUMBAR SPINE FINDINGS

Segmentation: Standard. Lowest well-formed disc space labeled the
L5-S1 level.

Alignment: Physiologic with preservation of the normal lumbar
lordosis. No listhesis.

Vertebrae: Vertebral body height maintained without evidence for
acute or chronic fracture. Bone marrow signal intensity within
normal limits. Few scattered benign hemangiomata noted. Reactive
endplate changes present about the L4-5 and L5-S1 interspace. No
other abnormal marrow edema.

Conus medullaris and cauda equina: Conus extends to the T12 level.
Conus and cauda equina appear normal.

Paraspinal and other soft tissues: Paraspinous soft tissues within
normal limits. Subcentimeter T1 hyperintense lesion present at the
lower pole the left kidney, indeterminate, but suspected to reflect
a small proteinaceous and/or hemorrhagic cyst. Visualized visceral
structures otherwise unremarkable.

Disc levels:

L1-2:  Unremarkable.

L2-3:  Unremarkable.

L3-4:  Unremarkable.

L4-5: Diffuse disc bulge with disc desiccation and intervertebral
disc space narrowing. Mild reactive endplate changes. Superimposed
central/left subarticular disc protrusion with inferior migration.
Superimposed mild facet hypertrophy. Resultant moderate to severe
canal with left worse than right lateral recess stenosis. Mild
bilateral L4 foraminal narrowing, right worse than left.

L5-S1: Chronic intervertebral disc space narrowing with diffuse disc
bulge and disc desiccation. Superimposed reactive endplate changes.
Central disc protrusion with slight caudad angulation, closely
approximating the descending S1 nerve roots without frank neural
impingement or displacement. Mild facet hypertrophy. Resultant mild
bilateral lateral recess stenosis. Central canal remains patent. No
significant foraminal stenosis.
IMPRESSION: MRI CERVICAL SPINE IMPRESSION:

1. Multilevel mild cervical facet degeneration, similar to previous.
2. No significant disc pathology, stenosis, or evidence for neural
impingement.

MRI THORACIC SPINE IMPRESSION:

1. Multilevel disc protrusions at T2-3, T7-8, T8-9, and T11-12 as
above. No significant stenosis.
2. Syringomyelia extending from T4 through T7, measuring up to 3 mm
in maximal diameter.

MRI LUMBAR SPINE IMPRESSION:

1. Central/left subarticular disc protrusion at L4-5 with resultant
moderate to severe canal, with left worse than right lateral recess
stenosis. Finding could contribute to left lower extremity radicular
symptoms.
2. Shallow central disc protrusion at L5-S1, closely approximating
both of the descending S1 nerve roots bilaterally without frank
neural impingement.

## 2020-11-06 IMAGING — CR DG LUMBAR SPINE 2-3V
2 series · 2 of 2 positions shown · non-contrast
Comparison: MRI lumbar spine 10/22/2019.

CLINICAL DATA: Localization images for patient undergoing lumbar
surgery.

EXAM:
LUMBAR SPINE - 2-3 VIEW

[lateral (1 of 2)]
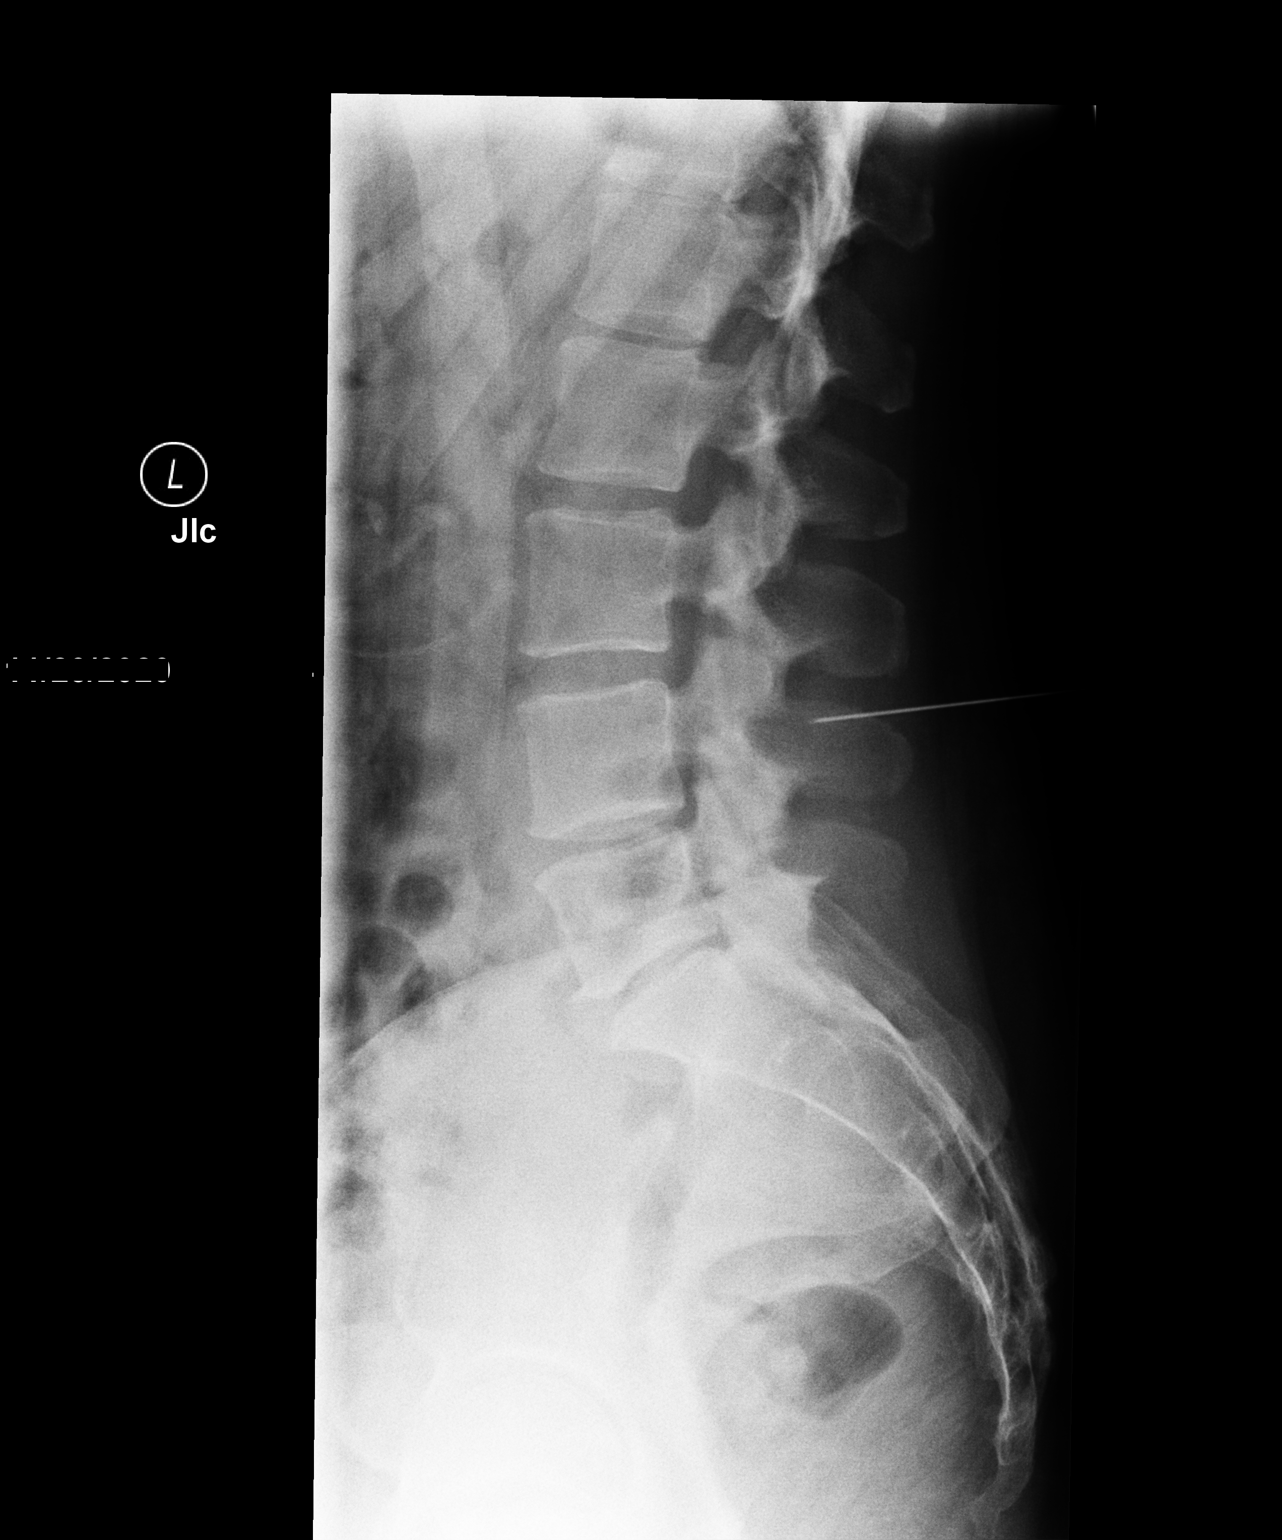

[lateral (2 of 2)]
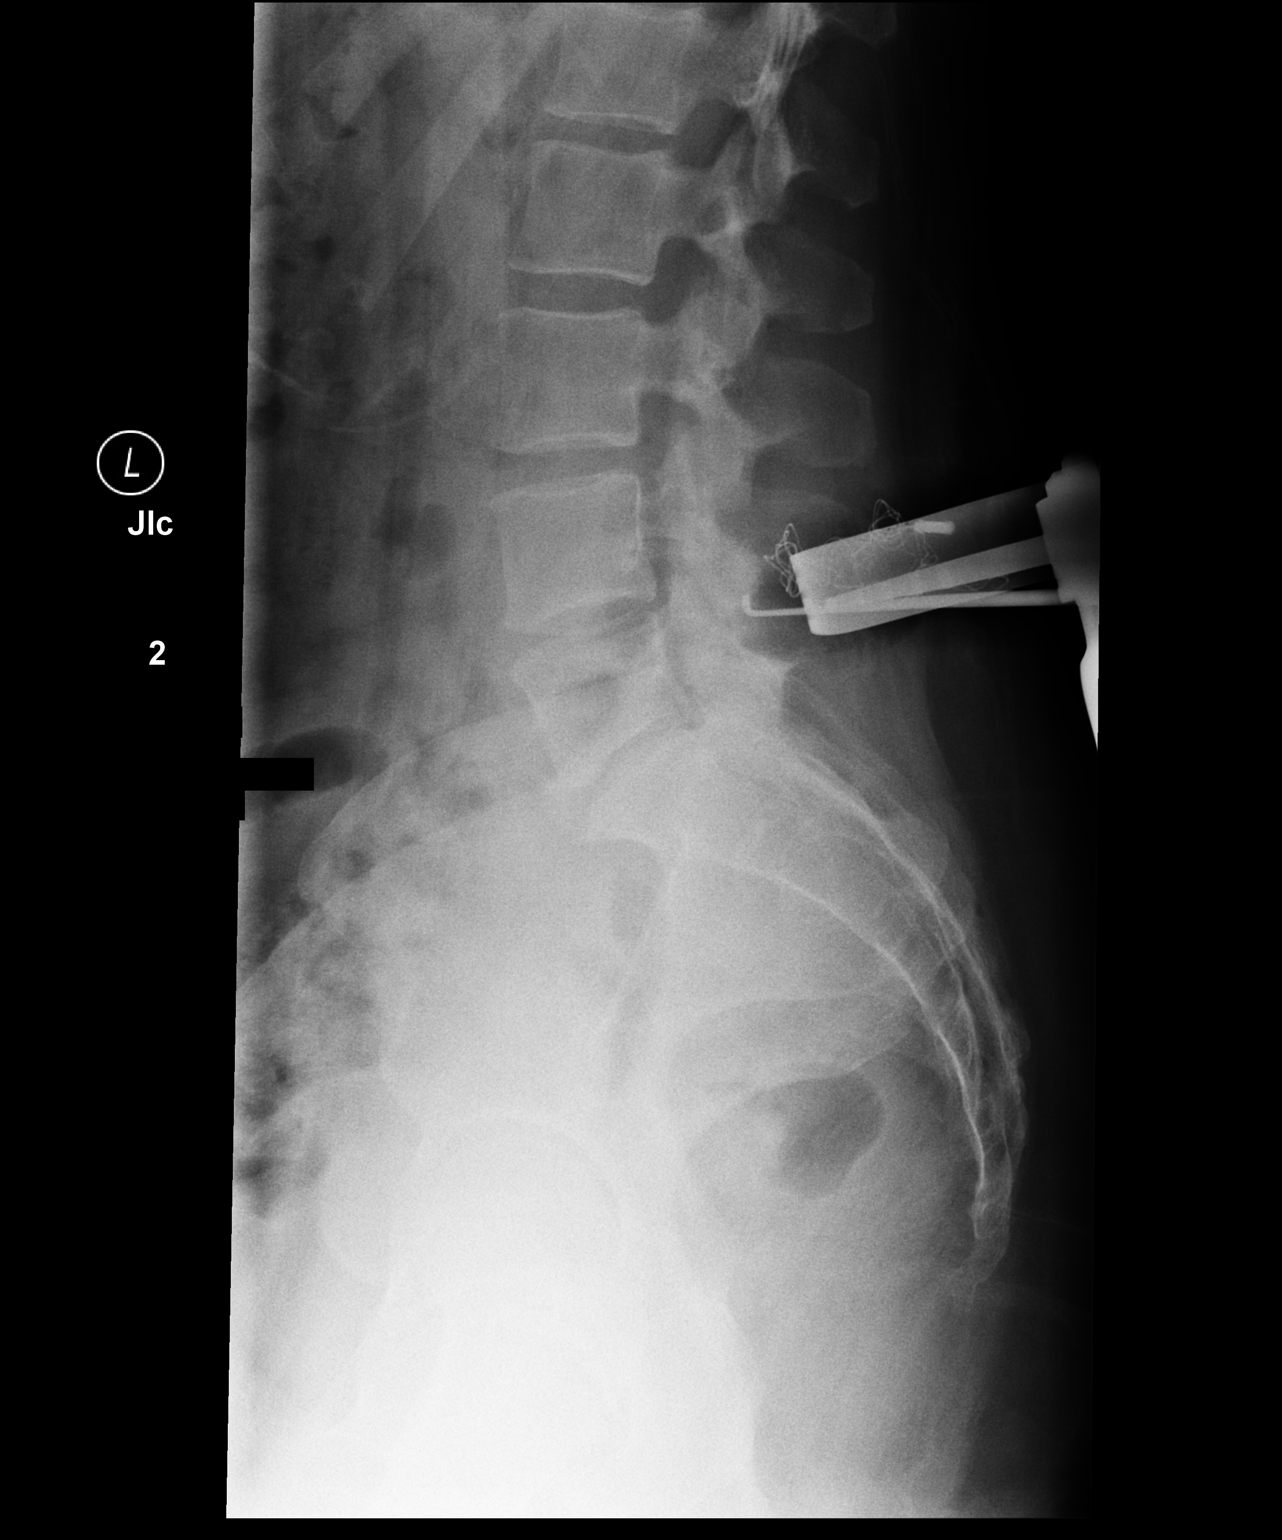

[2 of 2 positions shown; findings below may reference images not displayed]

FINDINGS: Two images of the lumbar spine in lateral projection are provided.
On the first image, a probe is at the level of the L4 pedicles. On
the second image, a clamp is on the L4 and likely L5 spinous
processes and a probe is at the level of L4-5.
IMPRESSION: L4-5 localization.

## 2021-07-23 ENCOUNTER — Ambulatory Visit (HOSPITAL_COMMUNITY)
Admission: RE | Admit: 2021-07-23 | Discharge: 2021-07-23 | Disposition: A | Discharge status: Home / Self Care | Payer: Medicaid Other | Source: Ambulatory Visit | Attending: Neurology | Admitting: Neurology

## 2021-07-23 ENCOUNTER — Other Ambulatory Visit (HOSPITAL_COMMUNITY): Payer: Self-pay | Admitting: Neurology

## 2021-07-23 ENCOUNTER — Other Ambulatory Visit: Payer: Self-pay

## 2021-07-23 DIAGNOSIS — M533 Sacrococcygeal disorders, not elsewhere classified: Secondary | ICD-10-CM

## 2022-08-02 ENCOUNTER — Other Ambulatory Visit (HOSPITAL_COMMUNITY): Payer: Self-pay | Admitting: Neurology

## 2022-08-02 ENCOUNTER — Ambulatory Visit (HOSPITAL_COMMUNITY)
Admission: RE | Admit: 2022-08-02 | Discharge: 2022-08-02 | Disposition: A | Discharge status: Home / Self Care | Payer: Medicaid Other | Source: Ambulatory Visit | Attending: Neurology | Admitting: Neurology

## 2022-08-02 DIAGNOSIS — G8929 Other chronic pain: Secondary | ICD-10-CM

## 2022-08-02 DIAGNOSIS — M25512 Pain in left shoulder: Secondary | ICD-10-CM | POA: Insufficient documentation

## 2022-08-02 DIAGNOSIS — M5412 Radiculopathy, cervical region: Secondary | ICD-10-CM | POA: Insufficient documentation

## 2022-08-02 DIAGNOSIS — M25511 Pain in right shoulder: Secondary | ICD-10-CM | POA: Insufficient documentation

## 2024-05-07 ENCOUNTER — Encounter (HOSPITAL_BASED_OUTPATIENT_CLINIC_OR_DEPARTMENT_OTHER): Payer: Self-pay | Admitting: Internal Medicine

## 2024-05-07 DIAGNOSIS — R0681 Apnea, not elsewhere classified: Secondary | ICD-10-CM

## 2024-05-07 DIAGNOSIS — R0683 Snoring: Secondary | ICD-10-CM

## 2024-06-16 ENCOUNTER — Encounter: Payer: Self-pay | Admitting: Cardiology

## 2024-06-16 ENCOUNTER — Ambulatory Visit: Attending: Cardiology | Admitting: Cardiology

## 2024-06-16 ENCOUNTER — Encounter: Payer: Self-pay | Admitting: Internal Medicine

## 2024-06-16 VITALS — BP 138/62 | HR 65 | Resp 16 | Ht 68.0 in | Wt 168.2 lb

## 2024-06-16 DIAGNOSIS — I471 Supraventricular tachycardia, unspecified: Secondary | ICD-10-CM | POA: Diagnosis present

## 2024-06-16 DIAGNOSIS — R002 Palpitations: Secondary | ICD-10-CM | POA: Diagnosis present

## 2024-06-16 NOTE — Patient Instructions (Signed)
Medication Instructions:  Your physician recommends that you continue on your current medications as directed. Please refer to the Current Medication list given to you today.   Labwork: None  Testing/Procedures: None  Follow-Up: Your physician recommends that you schedule a follow-up appointment in: 3 months  Any Other Special Instructions Will Be Listed Below (If Applicable).  Thank you for choosing  [facility]!      If you need a refill on your cardiac medications before your next appointment, please call your pharmacy.

## 2024-06-16 NOTE — Progress Notes (Signed)
 Clinical Summary Kelly Mack is a 43 y.o.female seen today as a new consult, referred by Dr Maree for the following medical problems.   1.Palpitations - symptoms on and off nearly 20 years.  - recently symptoms have increased. Feeling of heart dropping, feels like thumping really hard and can feel faint - occurs 3-4 times a month. No specific triggers. Last 3-5 minutes - coffee energy drinks x 2, once soda, no tea.Rare EtoH  - from pcp note prior zio showed SVT - monitor reviewed, does show 2 episodes of SVT, longest 11 beats.    Past Medical History:  Diagnosis Date   Anemia    Anxiety    DDD (degenerative disc disease), lumbar    and neck   DDD (degenerative disc disease), lumbar    Depression    Headache    migraines   IBS (irritable bowel syndrome)    Meningitis    2015     No Known Allergies   Current Outpatient Medications  Medication Sig Dispense Refill   gabapentin  (NEURONTIN ) 600 MG tablet Take 600 mg by mouth 4 (four) times daily.     ibuprofen (ADVIL) 800 MG tablet Take 800 mg by mouth 3 (three) times daily with meals.      PARoxetine  (PAXIL ) 10 MG tablet Take 10 mg by mouth at bedtime.     PERCOCET 10-325 MG tablet Take 1 tablet by mouth every 4 (four) hours.     tiZANidine  (ZANAFLEX ) 2 MG tablet Take 2 mg by mouth at bedtime.     traMADol (ULTRAM) 50 MG tablet Take 100 mg by mouth every 8 (eight) hours as needed for pain.     traZODone (DESYREL) 50 MG tablet Take 100 mg by mouth at bedtime.     No current facility-administered medications for this visit.     Past Surgical History:  Procedure Laterality Date   CESAREAN SECTION     x 3   DILATION AND CURETTAGE OF UTERUS  2013   ECTOPIC PREGNANCY SURGERY  2006   LUMBAR LAMINECTOMY/DECOMPRESSION MICRODISCECTOMY Left 11/01/2019   Procedure: Microdiscectomy - Lumbar Four-Lumbar Five - left;  Surgeon: Joshua Alm RAMAN, MD;  Location[facility] OR;  Service: Neurosurgery;  Laterality: Left;  Microdiscectomy -  Lumbar Four-Lumbar Five - left   TUBAL LIGATION       No Known Allergies    No family history on file.   Social History Ms. Beamon reports that she quit smoking about 19 years ago. She has never used smokeless tobacco. Ms. Ladouceur reports current alcohol use.    Physical Examination Today's Vitals   06/16/24 1408  BP: 138/62  Pulse: 65  Resp: 16  SpO2: 98%  Weight: 168 lb 3.2 oz (76.3 kg)  Height: 5' 8 (1.727 m)   Body mass index is 25.57 kg/m.  Gen: resting comfortably, no acute distress HEENT: no scleral icterus, pupils equal round and reactive, no palptable cervical adenopathy,  CV: RRR, no m/r,g no jvd Resp: Clear to auscultation bilaterally GI: abdomen is soft, non-tender, non-distended, normal bowel sounds, no hepatosplenomegaly MSK: extremities are warm, no edema.  Skin: warm, no rash Neuro:  no focal deficits Psych: appropriate affect     Assessment and Plan  1.Palpitations - prior monitor shows some short runs of SVT - discuss initially weaning caffeine, drinking 2 energy drinks daily - if ongoing symptoms would try lopressor 12.5mg  bid, she will update us  on symptoms - EKG today shows NSR  F/u 3  months      Dorn PHEBE Ross, M.D.

## 2024-07-13 NOTE — Procedures (Signed)
 Orders only

## 2024-09-09 ENCOUNTER — Ambulatory Visit: Admitting: Cardiology

## 2024-09-16 ENCOUNTER — Ambulatory Visit: Attending: Nurse Practitioner | Admitting: Nurse Practitioner

## 2024-09-16 VITALS — BP 106/64 | HR 78 | Ht 68.0 in | Wt 171.0 lb

## 2024-09-16 DIAGNOSIS — R002 Palpitations: Secondary | ICD-10-CM | POA: Diagnosis not present

## 2024-09-16 DIAGNOSIS — R42 Dizziness and giddiness: Secondary | ICD-10-CM | POA: Diagnosis not present

## 2024-09-16 DIAGNOSIS — R0609 Other forms of dyspnea: Secondary | ICD-10-CM | POA: Diagnosis not present

## 2024-09-16 MED ORDER — METOPROLOL TARTRATE 25 MG PO TABS: ORAL_TABLET | ORAL | 3 refills | Status: AC

## 2024-09-16 NOTE — Patient Instructions (Addendum)
 Medication Instructions:   START Metoprolol 12.5 mg twice a day for palpitations, take if systolic bp (top number) is over 110 Labwork: None today  Testing/Procedures: Your physician has requested that you have an echocardiogram. Echocardiography is a painless test that uses sound waves to create images of your heart. It provides your doctor with information about the size and shape of your heart and how well your heart's chambers and valves are working. This procedure takes approximately one hour. There are no restrictions for this procedure. Please do NOT wear cologne, perfume, aftershave, or lotions (deodorant is allowed). Please arrive 15 minutes prior to your appointment time.  Please note: We ask at that you not bring children with you during ultrasound (echo/ vascular) testing. Due to room size and safety concerns, children are not allowed in the ultrasound rooms during exams. Our front office staff cannot provide observation of children in our lobby area while testing is being conducted. An adult accompanying a patient to their appointment will only be allowed in the ultrasound room at the discretion of the ultrasound technician under special circumstances. We apologize for any inconvenience.   Follow-Up: 6-8 weeks E.Peck,NP  Any Other Special Instructions Will Be Listed Below (If Applicable).  If you need a refill on your cardiac medications before your next appointment, please call your pharmacy.

## 2024-09-16 NOTE — Progress Notes (Signed)
  Cardiology Office Note   Date:  09/16/2024  ID:  Kelly Mack, DOB 11/30/1981, MRN [id number] PCP: [facility]  [facility] Providers Cardiologist:  Alvan Carrier, MD     History of Present Illness Kelly Mack is a 43 y.o. female with a PMH of palpitations, anxiety, IBS, who presents today for 27-month follow-up appointment.  Last seen by Dr. Alvan on June 16, 2024.  She had noted intermittent palpitations for nearly 20 years and seem to have increased symptoms at the time.  Also noted near syncopal episodes when noticing the palpitations, occurring 3-4 times a month lasting for about 3 to 5 minutes, no specific triggers.  Did report caffeine intake and rare alcohol use.  Prior monitor was reviewed that she wore with her PCP that showed some short runs of SVT.  Weaning off caffeine use was discussed and recommended that if she had ongoing symptoms, would try Lopressor 12.5 mg twice daily and update us  on symptoms.  Today she presents for 67-month follow-up appointment.  She states she continues to note palpitations as well as DOE, particularly noted when going upstairs as well as associated lightheadedness. She has cut back her caffeine intake considerably. Now only drinking water and occasional Cheerwine. She is also pending a future sleep study, children have seen her stop breathing in her sleep and they notice jerky movements when she sleeps. Denies any chest pain, syncope, presyncope, orthopnea, PND, swelling or significant weight changes, acute bleeding, or claudication.  ROS: Negative. See HPI.   SH: She has three children.  FH: Mother died of MI at age 59, maternal aunt has history of stents. Half-brother has CHF (47 years old).  Studies Reviewed  EKG: EKG is not ordered today.   Cardiac monitor 08/2023 [facility]): See Scanned report under CV Proc  Physical Exam VS:  BP 106/64 (BP Location: Left Arm, Cuff Size: Normal)   Pulse 78   Ht 5' 8  (1.727 m)   Wt 171 lb (77.6 kg)   SpO2 99%   BMI 26.00 kg/m        Wt Readings from Last 3 Encounters:  09/16/24 171 lb (77.6 kg)  06/16/24 168 lb 3.2 oz (76.3 kg)  11/01/19 166 lb 0.1 oz (75.3 kg)    GEN: Well nourished, well developed in no acute distress NECK: No JVD; No carotid bruits CARDIAC: S1/S2, RRR, no murmurs, rubs, gallops RESPIRATORY:  Clear to auscultation without rales, wheezing or rhonchi  ABDOMEN: Soft, non-tender, non-distended EXTREMITIES:  No edema; No deformity   ASSESSMENT AND PLAN  Palpitations Continues to admit to palpitations despite weaning off caffeine use. I encouraged her to continue to wean off caffeine. Will begin Lopressor 12.5 mg BID PRN for palpitations and SBP > 110. BP well controlled at home per her report. She verbalized understanding of instructions. Will arrange Echo d/t her symptoms noted below. She will update us  in 1-2 weeks via MyChart about her symptoms. Heart healthy diet and regular cardiovascular exercise encouraged.  Continue to follow with PCP. Care and ED precautions discussed.   DOE, lightheadedness Etiology multifactorial, associated with her palpitations. Will arrange Echo for further evaluation. Care and ED precautions discussed.    Dispo: Follow-up with MD/APP in 6-8 weeks or sooner if anything changes.   Signed, Almarie Crate, NP

## 2024-09-30 ENCOUNTER — Ambulatory Visit: Attending: Nurse Practitioner

## 2024-09-30 DIAGNOSIS — R0609 Other forms of dyspnea: Secondary | ICD-10-CM | POA: Diagnosis present

## 2024-10-01 ENCOUNTER — Ambulatory Visit (HOSPITAL_BASED_OUTPATIENT_CLINIC_OR_DEPARTMENT_OTHER): Attending: Student | Admitting: Internal Medicine

## 2024-10-01 DIAGNOSIS — R0683 Snoring: Secondary | ICD-10-CM | POA: Diagnosis not present

## 2024-10-01 DIAGNOSIS — R0681 Apnea, not elsewhere classified: Secondary | ICD-10-CM | POA: Diagnosis present

## 2024-10-01 DIAGNOSIS — G4761 Periodic limb movement disorder: Secondary | ICD-10-CM | POA: Insufficient documentation

## 2024-10-02 LAB — ECHOCARDIOGRAM COMPLETE
AR max vel: 2.96 cm2
AV Area VTI: 3.05 cm2
AV Area mean vel: 2.94 cm2
AV Mean grad: 3.8 mmHg
AV Peak grad: 7.9 mmHg
Ao pk vel: 1.41 m/s
Area-P 1/2: 3.93 cm2
Calc EF: 69 %
S' Lateral: 3.7 cm
Single Plane A2C EF: 74 %
Single Plane A4C EF: 61.9 %

## 2024-10-10 NOTE — Procedures (Signed)
 [facility] [address] [place], [place] [address] Tel: [phone]   Fax: [phone]  Polysomnography Interpretation  Patient Name:  Kelly Mack, Kelly Mack Date:  10/01/2024 Referring Physician:  JACQULINE EASTERN [phone]) %%startinterp%% Indications for Polysomnography The patient is a 43 year-old Female who is 5' 8 and weighs 171.0 lbs. Her BMI equals 26.2.  A full night polysomnogram was performed to evaluate for -Snoring.  Medication  Gabapentin   Methocarbamol  Ibuprofen   Polysomnogram Data A full night polysomnogram recorded the standard physiologic parameters including EEG, EOG, EMG, EKG, nasal and oral airflow.  Respiratory parameters of chest and abdominal movements were recorded with Respiratory Inductance Plethysmography belts.  Oxygen saturation was recorded by pulse oximetry.   Sleep Architecture The total recording time of the polysomnogram was 393.0 minutes.  The total sleep time was 297.5 minutes.  The patient spent 3.4% of total sleep time in Stage N1, 26.7% in Stage N2, 52.3% in Stages N3, and 17.6% in REM.  Sleep latency was 80.0 minutes.  REM latency was 81.5 minutes.  Sleep Efficiency was 75.7%.  Wake after Sleep Onset time was 15.0 minutes.  Respiratory Events The polysomnogram revealed a presence of - obstructive, - central, and - mixed apneas resulting in an Apnea index of - events per hour.  There were 14 hypopneas (>=3% desaturation and/or arousal) resulting in an Apnea\Hypopnea Index (AHI >=3% desaturation and/or arousal) of 2.8 events per hour.  There were 3 hypopneas (>=4% desaturation) resulting in an Apnea\Hypopnea Index (AHI >=4% desaturation) of 0.6 events per hour.  There were 2 Respiratory Effort Related Arousals resulting in a RERA index of 0.4 events per hour. The Respiratory Disturbance Index is 3.2 events per hour.  The snore index was 20.6 events per hour.  Mean oxygen saturation was 94.9%.  The lowest  oxygen saturation during sleep was 89.0%.  Time spent <=88% oxygen saturation was - minutes (-).  Limb Activity There were 108 total limb movements recorded, of this total, 92 were classified as PLMs.  PLM index was 18.6 per hour and PLM associated with Arousals index was 0.6 per hour.  Cardiac Summary The average pulse rate was 62.8 bpm.  The minimum pulse rate was 50.0 bpm while the maximum pulse rate was 99.0 bpm.  Cardiac rhythm was normal/abnormal.  Comments: Occasional hypopneas, within normal limits, AHI(3%) 2.8/hr. Mild intermittent snoring with oxygen desaturation to a nadir of 89%, mean 94.9%. Periodic limb movement.  Diagnosis: Periodic limb movement,  Recommendations:  Consider managing limb movement sleep disorder.   This study was personally reviewed and electronically signed by: Reggy Salt, MD Accredited Board Certified in Sleep Medicine Date/Time: 10/10/24   12:07    %%endinterp%%   Diagnostic PSG Report  Patient Name: Kelly Mack, Kelly Mack Date: 10/01/2024  Date of Birth: [birth year] Study Type: Diagnostic  Age: 42 year MRN #: [id number]  Sex: Female Interpreting Physician: SALT REGGY, [id number]  Height: 5' 8 Referring Physician: JACQULINE EASTERN [phone])  Weight: 171.0 lbs Recording Tech: Holly Neeriemer RPSGT RST  BMI: 26.2 Scoring Tech: Holly Neeriemer RPSGT RST  ESS: 6 Neck Size: 14   Study Overview  Lights Off: 11:01:03 PM  Count Index  Lights On: 05:34:02 AM Awakenings: 6 1.2  Time in Bed: 393.0 min. Arousals: 26 5.2  Total Sleep Time: 297.5 min. AHI (>=3% Desat and/or Ar.): 14 2.8   Sleep Efficiency: 75.7% AHI (>=4% Desat): 3 0.6   Sleep Latency: 80.0 min. Limb Movements: 108 21.8  Wake After Sleep Onset: 15.0 min. Snore: 102 20.6  REM Latency from Sleep Onset: 81.5 min. Desaturations: 17 3.4     Minimum SpO2 TST: 89.0%    Sleep Architecture  % of Time in Bed Stages Time (mins) % Sleep Time  Wake 96.0   Stage N1 10.0 3.4%  Stage N2 79.5 26.7%   Stage N3 155.5 52.3%  REM 52.5 17.6%   Arousal Summary   NREM REM Sleep Index  Respiratory Arousals 1 - 1 0.2  PLM Arousals 3 - 3 0.6  Isolated Limb Movement Arousals 5 - 5 1.0  Snore Arousals 1 - 1 0.2  Spontaneous Arousals 15 1 16  3.2  Total 25 1 26  5.2   Limb Movement Summary   Count Index  Isolated Limb Movements 16 3.2  Periodic Limb Movements (PLMs) 92 18.6  Total Limb Movements 108 21.8    Respiratory Summary   By Sleep Stage By Body Position Total   NREM REM Supine Non-Supine   Time (min) 245.0 52.5 124.0 173.5 297.5         Obstructive Apnea - - - - -  Mixed Apnea - - - - -  Central Apnea - - - - -  Total Apneas - - - - -  Total Apnea Index - - - - -         Hypopneas (>=3% Desat and/or Ar.) 9 5 11 3 14   AHI (>=3% Desat and/or Ar.) 2.2 5.7 5.3 1.0 2.8         Hypopneas (>=4% Desat) 3 - 2 1 3   AHI (>=4% Desat) 0.7 - 1.0 0.3 0.6          RERAs 2 - 2 - 2  RERA Index 0.5 - 1.0 - 0.4         RDI 2.7 5.7 6.3 1.0 3.2    Respiratory Event Type Index  Central Apneas -  Obstructive Apneas -  Mixed Apneas -  Central Hypopneas -  Obstructive Hypopneas 2.8  Central Apnea + Hypopnea (CAHI) -  Obstructive Apnea + Hypopnea (OAHI) 2.8   Respiratory Event Durations   Apnea Hypopnea   NREM REM NREM REM  Average (seconds) - - 26.8 26.4  Maximum (seconds) - - 45.1 29.6    Oxygen Saturation Summary   Wake NREM REM TST TIB  Average SpO2 (%) 98.0% 94.0% 93.7% 93.9% 94.9%  Minimum SpO2 (%) 91.0% 89.0% 91.0% 89.0% 89.0%  Maximum SpO2 (%) 100.0% 99.0% 96.0% 99.0% 100.0%   Oxygen Saturation Distribution  Range (%) Time in range (min) Time in range (%)  90.0 - 100.0 387.9 99.8%  80.0 - 90.0 1.0 0.2%  70.0 - 80.0 - -  60.0 - 70.0 - -  50.0 - 60.0 - -  0.0 - 50.0 - -  Time Spent <=88% SpO2  Range (%) Time in range (min) Time in range (%)  0.0 - 88.0 - -      Count Index  Desaturations 17 3.4    Cardiac Summary   Wake NREM REM Sleep Total  Average  Pulse Rate (BPM) 68.8 60.6 62.5 60.9 62.8  Minimum Pulse Rate (BPM) 58.0 50.0 53.0 50.0 50.0  Maximum Pulse Rate (BPM) 98.0 99.0 76.0 99.0 99.0   Pulse Rate Distribution:  Range (bpm) Time in range (min) Time in range (%)  0.0 - 40.0 - -  40.0 - 60.0 154.9 39.8%  60.0 - 80.0 231.8 59.6%  80.0 - 100.0 2.4 0.6%  100.0 - 120.0 - -  120.0 - 140.0 - -  140.0 - 200.0 - -      Hypnograms                      Technologist Comments  The patient was a 43 y/o female in the [facility] for a Diagnostic NPSG. The associated dx are snoring and witnessed apneas. Study performed in sleep room #1.  The patient arrived to the lab without complaint. The study process was explained. The patient had a long period of wake, and states she normally goes to sleep around or after midnight. She watched her tablet for a while.  She was observed sleeping supine, and lateral L & R. Mild, intermittent snoring was noted. Rare hypopneic events were noted. PLMs and PLMAs were frequent.   Restroom visit: none during recording Supplemental O2: N/A ECG: appeared to be NSR PLMs/PLMAs frequent snore: mild and intermittent REM periods: 3 No parasomnias observed.                             Reggy Salt Diplomate, Biomedical Engineer of Sleep Medicine  ELECTRONICALLY SIGNED ON:  10/10/2024, 12:01 PM [facility] PH: [phone]   FX: [phone] ACCREDITED BY THE AMERICAN ACADEMY OF SLEEP MEDICINE

## 2024-10-10 NOTE — Procedures (Signed)
  Indications for Polysomnography The patient is a 43 year-old Female who is 5' 8 and weighs 171.0 lbs. Her BMI equals 26.2.  A full night polysomnogram was performed to evaluate for -.  MedicationGabapentinMethocarbamolIbuprofen Polysomnogram Data A full night polysomnogram recorded the standard physiologic parameters including EEG, EOG, EMG, EKG, nasal and oral airflow.  Respiratory parameters of chest and abdominal movements were recorded with Respiratory Inductance Plethysmography belts.   Oxygen saturation was recorded by pulse oximetry.  Sleep Architecture The total recording time of the polysomnogram was 393.0 minutes.  The total sleep time was 297.5 minutes.  The patient spent 3.4% of total sleep time in Stage N1, 26.7% in Stage N2, 52.3% in Stages N3, and 17.6% in REM.  Sleep latency was 80.0 minutes.   REM latency was 81.5 minutes.  Sleep Efficiency was 75.7%.  Wake after Sleep Onset time was 15.0 minutes.  Respiratory Events The polysomnogram revealed a presence of - obstructive, - central, and - mixed apneas resulting in an Apnea index of - events per hour.  There were 14 hypopneas (GreaterEqual to3% desaturation and/or arousal) resulting in an Apnea\Hypopnea Index (AHI  GreaterEqual to3% desaturation and/or arousal) of 2.8 events per hour.  There were 3 hypopneas (GreaterEqual to4% desaturation) resulting in an Apnea\Hypopnea Index (AHI GreaterEqual to4% desaturation) of 0.6 events per hour.  There were 2 Respiratory  Effort Related Arousals resulting in a RERA index of 0.4 events per hour. The Respiratory Disturbance Index is 3.2 events per hour.  The snore index was 20.6 events per hour.  Mean oxygen saturation was 94.9%.  The lowest oxygen saturation during sleep was 89.0%.  Time spent LessEqual to88% oxygen saturation was  minutes ().  Limb Activity There were 108 total limb movements recorded, of this total, 92 were classified as PLMs.  PLM index was 18.6 per hour and PLM  associated with Arousals index was 0.6 per hour.  Cardiac Summary The average pulse rate was 62.8 bpm.  The minimum pulse rate was 50.0 bpm while the maximum pulse rate was 99.0 bpm.  Cardiac rhythm was normal/abnormal.  Comments:  Diagnosis:  Recommendations:   This study was personally reviewed and electronically signed by: Reggy Salt, MD Accredited Board Certified in Sleep Medicine Date/Time:

## 2024-11-12 ENCOUNTER — Ambulatory Visit: Admitting: Nurse Practitioner

## 2024-11-15 ENCOUNTER — Ambulatory Visit: Payer: Self-pay | Admitting: Nurse Practitioner

## 2024-11-15 ENCOUNTER — Ambulatory Visit: Attending: Nurse Practitioner | Admitting: Nurse Practitioner
